# Patient Record
Sex: Female | Born: 1940 | Race: White | Hispanic: No | Smoking: Never smoker
Health system: Southern US, Community
[De-identification: ages and names within clinical notes are randomized; demographics above are authoritative.]

## PROBLEM LIST (undated history)

## (undated) DIAGNOSIS — M810 Age-related osteoporosis without current pathological fracture: Secondary | ICD-10-CM

## (undated) DIAGNOSIS — R51 Headache: Secondary | ICD-10-CM

## (undated) DIAGNOSIS — K219 Gastro-esophageal reflux disease without esophagitis: Secondary | ICD-10-CM

## (undated) DIAGNOSIS — C801 Malignant (primary) neoplasm, unspecified: Secondary | ICD-10-CM

## (undated) DIAGNOSIS — Z923 Personal history of irradiation: Secondary | ICD-10-CM

## (undated) DIAGNOSIS — T8859XA Other complications of anesthesia, initial encounter: Secondary | ICD-10-CM

## (undated) DIAGNOSIS — R519 Headache, unspecified: Secondary | ICD-10-CM

## (undated) DIAGNOSIS — M1990 Unspecified osteoarthritis, unspecified site: Secondary | ICD-10-CM

## (undated) DIAGNOSIS — C50919 Malignant neoplasm of unspecified site of unspecified female breast: Secondary | ICD-10-CM

## (undated) DIAGNOSIS — Z9889 Other specified postprocedural states: Secondary | ICD-10-CM

## (undated) DIAGNOSIS — T4145XA Adverse effect of unspecified anesthetic, initial encounter: Secondary | ICD-10-CM

## (undated) DIAGNOSIS — E7800 Pure hypercholesterolemia, unspecified: Secondary | ICD-10-CM

## (undated) DIAGNOSIS — R112 Nausea with vomiting, unspecified: Secondary | ICD-10-CM

## (undated) DIAGNOSIS — T7840XA Allergy, unspecified, initial encounter: Secondary | ICD-10-CM

## (undated) DIAGNOSIS — R12 Heartburn: Secondary | ICD-10-CM

## (undated) DIAGNOSIS — R7303 Prediabetes: Secondary | ICD-10-CM

## (undated) DIAGNOSIS — R002 Palpitations: Secondary | ICD-10-CM

## (undated) HISTORY — PX: TUBAL LIGATION: SHX77

## (undated) HISTORY — DX: Palpitations: R00.2

## (undated) HISTORY — DX: Malignant (primary) neoplasm, unspecified: C80.1

## (undated) HISTORY — DX: Pure hypercholesterolemia, unspecified: E78.00

## (undated) HISTORY — DX: Allergy, unspecified, initial encounter: T78.40XA

## (undated) HISTORY — PX: INNER EAR SURGERY: SHX679

## (undated) HISTORY — PX: VEIN SURGERY: SHX48

## (undated) HISTORY — DX: Heartburn: R12

## (undated) HISTORY — PX: TONSILLECTOMY AND ADENOIDECTOMY: SHX28

## (undated) HISTORY — DX: Age-related osteoporosis without current pathological fracture: M81.0

## (undated) HISTORY — DX: Gastro-esophageal reflux disease without esophagitis: K21.9

---

## 2008-04-24 HISTORY — PX: SKIN CANCER EXCISION: SHX779

## 2010-08-15 ENCOUNTER — Ambulatory Visit: Payer: Self-pay | Admitting: Gynecology

## 2010-12-16 ENCOUNTER — Encounter (INDEPENDENT_AMBULATORY_CARE_PROVIDER_SITE_OTHER): Payer: Medicare Other | Admitting: Ophthalmology

## 2010-12-16 DIAGNOSIS — H43819 Vitreous degeneration, unspecified eye: Secondary | ICD-10-CM

## 2010-12-16 DIAGNOSIS — D3130 Benign neoplasm of unspecified choroid: Secondary | ICD-10-CM

## 2010-12-16 DIAGNOSIS — H3530 Unspecified macular degeneration: Secondary | ICD-10-CM

## 2010-12-16 DIAGNOSIS — H2510 Age-related nuclear cataract, unspecified eye: Secondary | ICD-10-CM

## 2011-02-07 ENCOUNTER — Ambulatory Visit: Payer: Medicare Other | Admitting: Gynecology

## 2011-02-28 ENCOUNTER — Ambulatory Visit (INDEPENDENT_AMBULATORY_CARE_PROVIDER_SITE_OTHER): Payer: Medicare Other | Admitting: Gynecology

## 2011-02-28 ENCOUNTER — Other Ambulatory Visit (HOSPITAL_COMMUNITY)
Admission: RE | Admit: 2011-02-28 | Discharge: 2011-02-28 | Disposition: A | Discharge status: Home / Self Care | Payer: Medicare Other | Source: Ambulatory Visit | Attending: Gynecology | Admitting: Gynecology

## 2011-02-28 ENCOUNTER — Encounter: Payer: Self-pay | Admitting: Anesthesiology

## 2011-02-28 VITALS — BP 130/84 | Ht 62.5 in | Wt 122.0 lb

## 2011-02-28 DIAGNOSIS — Z01419 Encounter for gynecological examination (general) (routine) without abnormal findings: Secondary | ICD-10-CM | POA: Insufficient documentation

## 2011-02-28 DIAGNOSIS — E7800 Pure hypercholesterolemia, unspecified: Secondary | ICD-10-CM | POA: Insufficient documentation

## 2011-02-28 DIAGNOSIS — C44721 Squamous cell carcinoma of skin of unspecified lower limb, including hip: Secondary | ICD-10-CM | POA: Insufficient documentation

## 2011-02-28 DIAGNOSIS — Z1211 Encounter for screening for malignant neoplasm of colon: Secondary | ICD-10-CM

## 2011-02-28 DIAGNOSIS — Z124 Encounter for screening for malignant neoplasm of cervix: Secondary | ICD-10-CM

## 2011-02-28 DIAGNOSIS — N951 Menopausal and female climacteric states: Secondary | ICD-10-CM | POA: Insufficient documentation

## 2011-02-28 NOTE — Progress Notes (Signed)
Addended by: Bertram Savin A on: 02/28/2011 10:02 AM   Modules accepted: Orders

## 2011-02-28 NOTE — Progress Notes (Addendum)
Crystal Tate 10-26-1940 [id number]   History:    70 y.o.  for annual exam who moved to [place] from [place]. She has established with her primary care physician Dr. Leonette Most who has been monitoring her hypercholesterolemia and according to the patient on her labs were drawn recently. In May of 2011 patient had a stereotactic biopsy of the right breast which patient stated was reported to be benign and had a follow up scan on November 2011 which was normal. Patient frequently does her self breast examination. 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 Patient also stated that she took estrogen replacement therapy for 6-7 years and still continues to suffer from vasomotor symptoms at times. Patient states her last bone density study was one year ago.  Past medical history,surgical history, family history and social history were all reviewed and documented in the EPIC chart.  Gynecologic History Patient's last menstrual period was 02/28/1999. Contraception: none Last Pap: 2011. Results were: normal Last mammogram: 2011. Results were: normal  Obstetric History OB History    Grav Para Term Preterm Abortions TAB SAB Ect Mult Living   2 2 2       2      # Outc Date GA Lbr Len/2nd Wgt Sex Del Anes PTL Lv   1 TRM     F SVD   Yes   2 TRM      SVD   Yes       ROS:  Was performed and pertinent positives and negatives are included in the history.  Exam: chaperone present  BP 130/84  Ht 5' 2.5" (1.588 m)  Wt 122 lb (55.339 kg)  BMI 21.96 kg/m2  LMP 02/28/1999  Body mass index is 21.96 kg/(m^2).  General appearance : Well developed well nourished female. No acute distress HEENT: Neck supple, trachea midline, no carotid bruits, no thyroidmegaly Lungs: Clear to auscultation,  no rhonchi or wheezes, or rib retractions  Heart: Regular rate and rhythm, no murmurs or gallops Breast:Examined in sitting and supine position were symmetrical in appearance, no palpable masses or tenderness,  no skin retraction, no nipple inversion, no nipple discharge, no skin discoloration, no axillary or supraclavicular lymphadenopathy Abdomen: no palpable masses or tenderness, no rebound or guarding Extremities: no edema or skin discoloration or tenderness  Pelvic:  Bartholin, Urethra, Skene Glands: Within normal limits             Vagina: No gross lesions or discharge  Cervix: No gross lesions or discharge  Uterus  anteverted, normal size, shape and consistency, non-tender and mobile  Adnexa  Without masses or tenderness  Anus and perineum  normal   Rectovaginal  normal sphincter tone without palpated masses or tenderness             Hemoccult kit provided to patient is made to the office for testing     Assessment/Plan:  70 y.o. female for annual exam with past history of a right breast stereotactic biopsy in [place] in May of 2011 reported to be benign. Patient follow up scan November 2011 reportedly normal. Patient will be sent to the radiologist her screening mammogram of both breasts. She was encouraged to do her monthly self breast examination. Fecal occult blood testing kit was provided to  the patient for her to submit to the office for testing. We'll do a bone density study next year. She was encouraged to continue her calcium vitamin D to twice a day. She is to engage in weightbearing exercises 45-45 minutes 3 times a week for osteoporosis prevention. And she will use soy products to help with some of her mild vasomotor symptoms and over-the-counter products list was provided to the patient as well. She will continue to follow with her primary physician and was in a copy of this office notes.    Ok Edwards MD, 9:42 AM 02/28/2011    Pap smear was done today

## 2011-03-01 ENCOUNTER — Other Ambulatory Visit: Payer: Self-pay | Admitting: Gynecology

## 2011-03-01 DIAGNOSIS — Z1231 Encounter for screening mammogram for malignant neoplasm of breast: Secondary | ICD-10-CM

## 2011-03-13 LAB — POC HEMOCCULT BLD/STL (OFFICE/1-CARD/DIAGNOSTIC): Fecal Occult Blood, POC: NEGATIVE

## 2011-03-14 ENCOUNTER — Other Ambulatory Visit (INDEPENDENT_AMBULATORY_CARE_PROVIDER_SITE_OTHER): Payer: Medicare Other | Admitting: Gynecology

## 2011-03-14 DIAGNOSIS — Z1211 Encounter for screening for malignant neoplasm of colon: Secondary | ICD-10-CM

## 2011-04-05 ENCOUNTER — Ambulatory Visit
Admission: RE | Admit: 2011-04-05 | Discharge: 2011-04-05 | Disposition: A | Discharge status: Home / Self Care | Payer: Medicare Other | Source: Ambulatory Visit | Attending: Gynecology | Admitting: Gynecology

## 2011-04-05 DIAGNOSIS — Z1231 Encounter for screening mammogram for malignant neoplasm of breast: Secondary | ICD-10-CM

## 2011-05-08 ENCOUNTER — Encounter (HOSPITAL_BASED_OUTPATIENT_CLINIC_OR_DEPARTMENT_OTHER): Payer: Self-pay | Admitting: *Deleted

## 2011-05-08 ENCOUNTER — Emergency Department (HOSPITAL_BASED_OUTPATIENT_CLINIC_OR_DEPARTMENT_OTHER)
Admission: EM | Admit: 2011-05-08 | Discharge: 2011-05-08 | Disposition: A | Discharge status: Home / Self Care | Payer: Medicare Other | Attending: Emergency Medicine | Admitting: Emergency Medicine

## 2011-05-08 DIAGNOSIS — W268XXA Contact with other sharp object(s), not elsewhere classified, initial encounter: Secondary | ICD-10-CM | POA: Insufficient documentation

## 2011-05-08 DIAGNOSIS — S61209A Unspecified open wound of unspecified finger without damage to nail, initial encounter: Secondary | ICD-10-CM | POA: Insufficient documentation

## 2011-05-08 DIAGNOSIS — S61219A Laceration without foreign body of unspecified finger without damage to nail, initial encounter: Secondary | ICD-10-CM

## 2011-05-08 DIAGNOSIS — Z79899 Other long term (current) drug therapy: Secondary | ICD-10-CM | POA: Insufficient documentation

## 2011-05-08 DIAGNOSIS — E7800 Pure hypercholesterolemia, unspecified: Secondary | ICD-10-CM | POA: Insufficient documentation

## 2011-05-08 DIAGNOSIS — Y92009 Unspecified place in unspecified non-institutional (private) residence as the place of occurrence of the external cause: Secondary | ICD-10-CM | POA: Insufficient documentation

## 2011-05-08 MED ORDER — TETANUS-DIPHTH-ACELL PERTUSSIS 5-2.5-18.5 LF-MCG/0.5 IM SUSP
0.5000 mL | Freq: Once | INTRAMUSCULAR | Status: AC
  Administered 2011-05-08: 0.5 mL via INTRAMUSCULAR
  Filled 2011-05-08: qty 0.5

## 2011-05-08 NOTE — ED Notes (Signed)
Moving clothes dryer cut finger on vent attached to dryer bleeding controlled on arrival

## 2011-05-08 NOTE — ED Provider Notes (Signed)
History     CSN: [id number]  Arrival date & time 05/08/11  0915   First MD Initiated Contact with Patient 05/08/11 [phone]      Chief Complaint  Patient presents with  . Laceration    (Consider location/radiation/quality/duration/timing/severity/associated sxs/prior treatment) HPI Patient cut right ring finger this morning moving close to her. She cut the finger on the bed. She did bleed initially but has bleeding controlled on presentation here. She denies any numbness or tingling. Her tetanus is not up-to-date. She is on aspirin but no other blood thinners. Past Medical History  Diagnosis Date  . High cholesterol   . Heart burn   . Cancer     SQUAMOS CELL , LEFT LEG    Past Surgical History  Procedure Date  . Tonsillectomy and adenoidectomy   . Tubal ligation   . Vein surgery   . Inner ear surgery     LEFT..   . Skin cancer excision 2010    LEFT LEG, SQUAMOS CELL,    Family History  Problem Relation Age of Onset  . Cancer Father     ORAL   . Cancer Son 19    MELANOMA    History  Substance Use Topics  . Smoking status: Never Smoker   . Smokeless tobacco: Not on file  . Alcohol Use: 1.2 oz/week    2 Glasses of wine per week     GLASS OF WINE DAILY    OB History    Grav Para Term Preterm Abortions TAB SAB Ect Mult Living   2 2 2       2       Review of Systems  All other systems reviewed and are negative.    Allergies  Other  Home Medications   Current Outpatient Rx  Name Route Sig Dispense Refill  . ASPIRIN 81 MG PO TABS Oral Take 81 mg by mouth daily.      Marland Kitchen BIOTIN 1000 MCG PO TABS Oral Take 1,000 mcg by mouth 3 (three) times daily.      . OMEGA-3 FATTY ACIDS 1000 MG PO CAPS Oral Take 2 g by mouth daily.      Marland Kitchen OMEPRAZOLE 20 MG PO CPDR Oral Take 20 mg by mouth daily.      Marland Kitchen PRAVASTATIN SODIUM 10 MG PO TABS Oral Take 10 mg by mouth daily.        BP 115/57  Pulse 82  Temp(Src) 98.6 F (37 C) (Oral)  Resp 20  SpO2 100%  LMP  02/28/1999  Physical Exam  Vitals reviewed. Constitutional: She appears well-developed and well-nourished.  Musculoskeletal:       2.5 cm laceration palmar aspect fourth finger right hand between PIP and DIP joint. There is a flap present. It has good blood supply. She has full active range of motion with good 2 point sensation.  Neurological: She is alert.  Skin: Skin is warm and dry.  Psychiatric: She has a normal mood and affect.    ED Course  Procedures (including critical care time)  Labs Reviewed - No data to display No results found.   No diagnosis found.    MDM  Patient was anesthetized with 5 cc of 1% lidocaine placed in a digital block. The area was prepped with alcohol prior to the block being placed. The patient obtained good anesthesia. The wound on the palmar aspect of the third finger was visually explored with no evidence of foreign body or tendon involvement. The finger was  ranged to full through a full range of  motion. The flap was repaired with 3 5-0 proline sutures placed in simple interrupted fashion. Patient's tetanus will be updated. She'll be placed in a dressing and a splint. She is advised to have sutures out in 7-10 days        Hilario Quarry, MD 05/08/11 [id number]

## 2011-12-18 ENCOUNTER — Encounter (INDEPENDENT_AMBULATORY_CARE_PROVIDER_SITE_OTHER): Payer: Medicare Other | Admitting: Ophthalmology

## 2012-02-21 ENCOUNTER — Encounter (INDEPENDENT_AMBULATORY_CARE_PROVIDER_SITE_OTHER): Payer: Medicare Other | Admitting: Ophthalmology

## 2012-02-28 ENCOUNTER — Encounter (INDEPENDENT_AMBULATORY_CARE_PROVIDER_SITE_OTHER): Payer: Medicare Other | Admitting: Ophthalmology

## 2012-02-28 DIAGNOSIS — H2510 Age-related nuclear cataract, unspecified eye: Secondary | ICD-10-CM

## 2012-02-28 DIAGNOSIS — H3530 Unspecified macular degeneration: Secondary | ICD-10-CM

## 2012-02-28 DIAGNOSIS — H43819 Vitreous degeneration, unspecified eye: Secondary | ICD-10-CM

## 2012-02-28 DIAGNOSIS — D3130 Benign neoplasm of unspecified choroid: Secondary | ICD-10-CM

## 2012-02-29 ENCOUNTER — Ambulatory Visit (INDEPENDENT_AMBULATORY_CARE_PROVIDER_SITE_OTHER): Payer: Medicare Other | Admitting: Women's Health

## 2012-02-29 ENCOUNTER — Encounter: Payer: Self-pay | Admitting: Women's Health

## 2012-02-29 VITALS — BP 104/76 | Ht 62.75 in | Wt 120.0 lb

## 2012-02-29 DIAGNOSIS — M899 Disorder of bone, unspecified: Secondary | ICD-10-CM

## 2012-02-29 DIAGNOSIS — M8580 Other specified disorders of bone density and structure, unspecified site: Secondary | ICD-10-CM

## 2012-02-29 NOTE — Progress Notes (Signed)
Crystal Tate September 22, 1940 [id number]    History:    The patient presents for breast and pelvic exam. Postmenopausal/no HRT/no bleeding. History of normal Paps.Mammograms normal 2012, 13, benign breast biopsy 2011. Hypercholesterolemia treated by primary care. Last DEXA 2000 and done in [place] reports some weakness of her bones. Normal colonoscopy 2006, negative Hemoccult 2012. Current on immunizations.  Past medical history, past surgical history, family history and social history were all reviewed and documented in the EPIC chart. History of squamous skin cancer on  leg.  ROS:  A  ROS was performed and pertinent positives and negatives are included in the history.  Exam:  Filed Vitals:   02/29/12 0901  BP: 104/76    General appearance:  Normal Head/Neck:  Normal, without cervical or supraclavicular adenopathy. Thyroid:  Symmetrical, normal in size, without palpable masses or nodularity. Respiratory  Effort:  Normal  Auscultation:  Clear without wheezing or rhonchi Cardiovascular  Auscultation:  Regular rate, without rubs, murmurs or gallops  Edema/varicosities:  Not grossly evident Abdominal  Soft,nontender, without masses, guarding or rebound.  Liver/spleen:  No organomegaly noted  Hernia:  None appreciated  Skin  Inspection:  Grossly normal  Palpation:  Grossly normal Neurologic/psychiatric  Orientation:  Normal with appropriate conversation.  Mood/affect:  Normal  Genitourinary    Breasts: Examined lying and sitting.     Right: Without masses, retractions, discharge or axillary adenopathy.     Left: Without masses, retractions, discharge or axillary adenopathy.   Inguinal/mons:  Normal without inguinal adenopathy  External genitalia:  Normal  BUS/Urethra/Skene's glands:  Normal  Bladder:  Normal  Vagina:  Normal  Cervix:  Normal  Uterus:   normal in size, shape and contour.  Midline and mobile  Adnexa/parametria:     Rt: Without masses or tenderness.   Lt: Without  masses or tenderness.  Anus and perineum: Normal  Digital rectal exam: Normal sphincter tone without palpated masses or tenderness  Assessment/Plan:  71 y.o. M. WF G2 P2  for breast and pelvic exam with no complaints.   Normal postmenopausal exam Questionable osteopenia Hypercholesteremia-primary care labs and meds  Plan: DEXA, instructed to schedule here. Reviewed importance of exercise, vitamin D 2000 daily, home safety and fall prevention discussed. Home Hemoccult card given with instructions, UA, normal Pap 2012, reviewed new screening guidelines. SBE's, continue annual mammogram, calcium rich diet encouraged. Continue annual skin checks with dermatologist.   Harrington Challenger [facility], 12:53 PM 02/29/2012

## 2012-02-29 NOTE — Patient Instructions (Signed)

## 2012-03-04 ENCOUNTER — Other Ambulatory Visit: Payer: Self-pay | Admitting: Gynecology

## 2012-03-04 DIAGNOSIS — Z1231 Encounter for screening mammogram for malignant neoplasm of breast: Secondary | ICD-10-CM

## 2012-04-12 ENCOUNTER — Ambulatory Visit
Admission: RE | Admit: 2012-04-12 | Discharge: 2012-04-12 | Disposition: A | Discharge status: Home / Self Care | Payer: Medicare Other | Source: Ambulatory Visit | Attending: Gynecology | Admitting: Gynecology

## 2012-04-12 DIAGNOSIS — Z1231 Encounter for screening mammogram for malignant neoplasm of breast: Secondary | ICD-10-CM

## 2012-06-12 ENCOUNTER — Other Ambulatory Visit: Payer: Self-pay | Admitting: Gynecology

## 2012-11-13 ENCOUNTER — Encounter: Payer: Self-pay | Admitting: Women's Health

## 2012-11-13 ENCOUNTER — Ambulatory Visit (INDEPENDENT_AMBULATORY_CARE_PROVIDER_SITE_OTHER): Payer: Medicare Other | Admitting: Women's Health

## 2012-11-13 DIAGNOSIS — N3941 Urge incontinence: Secondary | ICD-10-CM

## 2012-11-13 NOTE — Patient Instructions (Signed)
Cystocele Repair A cystocele is a bulging, drooping hernia or break (rupture) of bladder tissue into the birth canal (vagina). This bulging or rupture occurs on the top front wall of the vagina. CAUSES  Cystocele is associated with weakness of the top front wall of the vagina due to stretching and tearing of the ligaments and muscles in the area. This is often the result of:  Multiple childbirths.  Continuous heavy lifting.  Chronic cough from asthma, emphysema, or smoking.  Being overweight.  Changes from aging.  Previous surgery in the vaginal area.  Menopause with loss of estrogen hormone and weakening of the ligaments and muscles around the bladder. SYMPTOMS   Uncontrolled loss of urine (incontinence) with cough, sneeze, or exercise.  Pelvic pressure.  Frequency or urgency to urinate because of inability to completely empty the bladder.  Bladder infections.  Needing to push on the upper vagina to help yourself pass urine. DIAGNOSIS  A cystocele can be diagnosed by doing a pelvic exam and observing the top of the vagina drooping or bulging into or out of the vagina. TREATMENT  Surgical options:  Cystocele repair is surgery that removes the hernia.  There are also different "sling" operations that may be used. Discuss the different types of surgeries to repair a cystocele with your caregiver. Your caregiver will decide what type of surgery will be best in your case. Nonsurgical options:  Kegel exercises. This helps strengthen and tighten the muscles and tissue in and around the bladder and vagina. This may help with mild cases of cystocele.  A pessary may help the cystocele. A pessary is a plastic or rubber device that lifts the bladder into place. A pessary must be fitted by a doctor.  Tampons or diaphragms that lift the bladder into place are sometimes helpful with a minor or small cystocele.  Estrogen may help with mild cases in menopausal and aging women. LET YOUR  CAREGIVER KNOW ABOUT:   Allergies to food or medicine.  Medicines taken, including vitamins, herbs, eyedrops, over-the-counter medicines, and creams.  Use of steroids (by mouth or creams).  Previous problems with anesthetics or numbing medicines.  History of bleeding problems or blood clots.  Previous surgery.  Other health problems, including diabetes and kidney problems.  Possibility of pregnancy, if this applies. RISKS AND COMPLICATIONS  All surgery is associated with risks.  There are risks with a general anesthesia. You should discuss this with your caregiver.  With spinal or epidural anesthesia, there may be an area that is not numbed, and you could feel pain.  Headache could occur with a spinal or epidural anesthetic.  The catheter you will have after surgery may not work properly or may get blocked and need to be replaced.  Excessive bleeding.  Infection.  Injury to surrounding structures.  Recurrence of the cystocele.  Surgery may not get rid of your symptoms. BEFORE THE PROCEDURE   Do not take aspirin or blood thinners for 1 week prior to surgery, unless instructed otherwise.  Do not eat or drink anything after midnight the night before surgery.  Let your caregiver know if you develop a cold or other infectious problems prior to surgery.  If being admitted the day of surgery, you should be present 1 hour prior to your procedure or as directed by your caregiver.  Plan and arrange for help when you go home from the hospital.  If you smoke, do not smoke for at least 2 weeks before the surgery.  Do not   drink any alcohol for 3 days before the surgery. PROCEDURE  You will be given an anesthetic to prevent you from feeling pain during surgery. This may be a general anesthetic that puts you to sleep, or a spinal or epidural anesthetic. You will be asleep or be numbed through the entire procedure. During cystocele repair, tissue is pulled from the sides and  around the top of the vagina to lift up the hernia. This removes the hernia so that the top of the vagina does not fall into the opening of the vagina. AFTER THE PROCEDURE  After surgery, you will be taken to the recovery room where a nurse will take care of you, checking your breathing, blood pressure, pulse, and your progress. When your caregiver feels you are stable, you will be taken to your room. You will have a drainage tube (Foley catheter) that will drain your bladder for 2 to 7 days or longer, until your bladder is working properly. This catheter is placed prior to surgery to help keep your bladder empty and out of the way during the procedure. After surgery, this will make passing your urine easier. The catheter will be removed when you can easily pass urine without this assistance. You may have gauze packing in the vagina that will be removed 1 to 2 days after the surgery. Usually, you will be given a medicine (antibiotic) that kills germs. You will be given pain medicine as needed. You can usually go home in 3 to 5 days. HOME CARE INSTRUCTIONS   Do not take baths. Take showers until your caregiver informs you otherwise.  Take antibiotics as directed by your caregiver.  Exercise as instructed. Do not perform exercises which increase the pressure inside your belly (abdomen), such as sit-ups or lifting weights, until your caregiver has given permission. Walking exercise is preferred.  Only take over-the-counter or prescription medicines for pain and discomfort as directed by your caregiver.  Do not drink alcohol while taking pain medicine.  Do not lift anything over 5 pounds.  Do not drive until your caregiver gives you permission.  Get plenty of rest and sleep.  Have someone help with your household chores for 1 to 2 weeks.  If you develop constipation, you may take a mild laxative with your caregiver's permission. Eating bran foods and drinking enough water and fluids to keep your  urine clear or pale yellow helps with constipation.  Do not take aspirin. It may cause bleeding.  You may resume normal diet and unstrenuous activities as directed.  Do not douche, use tampons, or engage in intercourse until your surgeon has given permission.  Change bandages (dressings) as directed.  Make and keep all your postoperative appointments. SEEK MEDICAL CARE IF:   You have abnormal vaginal discharge.  You develop a rash.  You are having a reaction to your medicine.  You develop nausea or vomiting. SEEK IMMEDIATE MEDICAL CARE IF:   You have redness, swelling, or increasing pain in the vaginal area.  You notice pus coming from the vagina.  You have a fever.  You notice a bad smell coming from the vagina.  You have increasing abdominal pain.  You have frequent urination or you notice burning during urination.  You notice blood in your urine.  You have excessive vaginal bleeding.  You cannot urinate. MAKE SURE YOU:   Understand these instructions.  Will watch your condition.  Will get help right away if you are not doing well or get worse. Document Released:   04/07/2000 Document Revised: 07/03/2011 Document Reviewed: 07/08/2009 [employer] Patient Information 2014 [employer].  

## 2012-11-13 NOTE — Progress Notes (Signed)
Patient ID: Crystal Tate, female   DOB: [birth year], 72 y.o.   MRN: [id number] Presents to discuss possible cystocele diagnosed by primary care. Had a UTI that needed to be treated by 2 different antibiotics. Totally asymptomatic now and has had a negative UA. Has some problems with urge incontinence. States when has to urinate, needs to go quickly or does have some leakage. Does not wear a pad, changes underwear one time a day or none due to leakage. Nocturia x1. Denies stress incontinence with cough, sneeze, laughing. Has 2 children the largest weighing 8 lbs. 8 oz.  Weight is stable, 115, but states does have difficulty losing weight in lower abdomen. Active lifestyle, plays golf and does a lot of gardening.  Exam: Appears well. External genitalia, varicosities noted on left labia extending down left leg. Mild cystocele noted, no change or protrusion with bearing down,and no leakage of urine. Bimanual, vaginal dryness, good pelvic support.  Minimal urge incontinence with mild cystocele  Plan: Reviewed cystocele.  Reviewed no intervention is needed at this time, reviewed importance of avoiding heavy lifting, emptying bladder regular intervals, avoiding caffeinated beverages. Instructed to call if problems with incontinence or a change.

## 2013-02-06 ENCOUNTER — Other Ambulatory Visit: Payer: Self-pay | Admitting: Dermatology

## 2013-02-28 ENCOUNTER — Ambulatory Visit (INDEPENDENT_AMBULATORY_CARE_PROVIDER_SITE_OTHER): Payer: Medicare Other | Admitting: Ophthalmology

## 2013-03-10 ENCOUNTER — Ambulatory Visit (INDEPENDENT_AMBULATORY_CARE_PROVIDER_SITE_OTHER): Payer: Medicare Other | Admitting: Ophthalmology

## 2013-03-19 ENCOUNTER — Encounter: Payer: Medicare Other | Admitting: Women's Health

## 2013-03-27 ENCOUNTER — Ambulatory Visit (INDEPENDENT_AMBULATORY_CARE_PROVIDER_SITE_OTHER): Payer: Medicare Other

## 2013-03-27 ENCOUNTER — Other Ambulatory Visit: Payer: Self-pay | Admitting: Gynecology

## 2013-03-27 DIAGNOSIS — M810 Age-related osteoporosis without current pathological fracture: Secondary | ICD-10-CM

## 2013-03-27 DIAGNOSIS — M8580 Other specified disorders of bone density and structure, unspecified site: Secondary | ICD-10-CM

## 2013-03-31 ENCOUNTER — Other Ambulatory Visit: Payer: Self-pay | Admitting: *Deleted

## 2013-03-31 DIAGNOSIS — M810 Age-related osteoporosis without current pathological fracture: Secondary | ICD-10-CM

## 2013-04-01 ENCOUNTER — Encounter: Payer: Self-pay | Admitting: Women's Health

## 2013-04-01 ENCOUNTER — Other Ambulatory Visit (HOSPITAL_COMMUNITY)
Admission: RE | Admit: 2013-04-01 | Discharge: 2013-04-01 | Disposition: A | Discharge status: Home / Self Care | Payer: Medicare Other | Source: Ambulatory Visit | Attending: Gynecology | Admitting: Gynecology

## 2013-04-01 ENCOUNTER — Ambulatory Visit (INDEPENDENT_AMBULATORY_CARE_PROVIDER_SITE_OTHER): Payer: Medicare Other | Admitting: Women's Health

## 2013-04-01 VITALS — BP 104/64 | Ht 62.75 in | Wt 116.0 lb

## 2013-04-01 DIAGNOSIS — Z124 Encounter for screening for malignant neoplasm of cervix: Secondary | ICD-10-CM | POA: Insufficient documentation

## 2013-04-01 DIAGNOSIS — M810 Age-related osteoporosis without current pathological fracture: Secondary | ICD-10-CM

## 2013-04-01 NOTE — Patient Instructions (Signed)
Health Recommendations for Postmenopausal Women Respected and ongoing research has looked at the most common causes of death, disability, and poor quality of life in postmenopausal women. The causes include heart disease, diseases of blood vessels, diabetes, depression, cancer, and bone loss (osteoporosis). Many things can be done to help lower the chances of developing these and other common problems: CARDIOVASCULAR DISEASE Heart Disease: A heart attack is a medical emergency. Know the signs and symptoms of a heart attack. Below are things women can do to reduce their risk for heart disease.   Do not smoke. If you smoke, quit.  Aim for a healthy weight. Being overweight causes many preventable deaths. Eat a healthy and balanced diet and drink an adequate amount of liquids.  Get moving. Make a commitment to be more physically active. Aim for 30 minutes of activity on most, if not all days of the week.  Eat for heart health. Choose a diet that is low in saturated fat and cholesterol and eliminate trans fat. Include whole grains, vegetables, and fruits. Read and understand the labels on food containers before buying.  Know your numbers. Ask your caregiver to check your blood pressure, cholesterol (total, HDL, LDL, triglycerides) and blood glucose. Work with your caregiver on improving your entire clinical picture.  High blood pressure. Limit or stop your table salt intake (try salt substitute and food seasonings). Avoid salty foods and drinks. Read labels on food containers before buying. Eating well and exercising can help control high blood pressure. STROKE  Stroke is a medical emergency. Stroke may be the result of a blood clot in a blood vessel in the brain or by a brain hemorrhage (bleeding). Know the signs and symptoms of a stroke. To lower the risk of developing a stroke:  Avoid fatty foods.  Quit smoking.  Control your diabetes, blood pressure, and irregular heart rate. THROMBOPHLEBITIS  (BLOOD CLOT) OF THE LEG  Becoming overweight and leading a stationary lifestyle may also contribute to developing blood clots. Controlling your diet and exercising will help lower the risk of developing blood clots. CANCER SCREENING  Breast Cancer: Take steps to reduce your risk of breast cancer.  You should practice "breast self-awareness." This means understanding the normal appearance and feel of your breasts and should include breast self-examination. Any changes detected, no matter how small, should be reported to your caregiver.  After age 40, you should have a clinical breast exam (CBE) every year.  Starting at age 40, you should consider having a mammogram (breast X-ray) every year.  If you have a family history of breast cancer, talk to your caregiver about genetic screening.  If you are at high risk for breast cancer, talk to your caregiver about having an MRI and a mammogram every year.  Intestinal or Stomach Cancer: Tests to consider are a rectal exam, fecal occult blood, sigmoidoscopy, and colonoscopy. Women who are high risk may need to be screened at an earlier age and more often.  Cervical Cancer:  Beginning at age 30, you should have a Pap test every 3 years as long as the past 3 Pap tests have been normal.  If you have had past treatment for cervical cancer or a condition that could lead to cancer, you need Pap tests and screening for cancer for at least 20 years after your treatment.  If you had a hysterectomy for a problem that was not cancer or a condition that could lead to cancer, then you no longer need Pap tests.    If you are between ages 65 and 70, and you have had normal Pap tests going back 10 years, you no longer need Pap tests.  If Pap tests have been discontinued, risk factors (such as a new sexual partner) need to be reassessed to determine if screening should be resumed.  Some medical problems can increase the chance of getting cervical cancer. In these  cases, your caregiver may recommend more frequent screening and Pap tests.  Uterine Cancer: If you have vaginal bleeding after reaching menopause, you should notify your caregiver.  Ovarian cancer: Other than yearly pelvic exams, there are no reliable tests available to screen for ovarian cancer at this time except for yearly pelvic exams.  Lung Cancer: Yearly chest X-rays can detect lung cancer and should be done on high risk women, such as cigarette smokers and women with chronic lung disease (emphysema).  Skin Cancer: A complete body skin exam should be done at your yearly examination. Avoid overexposure to the sun and ultraviolet light lamps. Use a strong sun block cream when in the sun. All of these things are important in lowering the risk of skin cancer. MENOPAUSE Menopause Symptoms: Hormone therapy products are effective for treating symptoms associated with menopause:  Moderate to severe hot flashes.  Night sweats.  Mood swings.  Headaches.  Tiredness.  Loss of sex drive.  Insomnia.  Other symptoms. Hormone replacement carries certain risks, especially in older women. Women who use or are thinking about using estrogen or estrogen with progestin treatments should discuss that with their caregiver. Your caregiver will help you understand the benefits and risks. The ideal dose of hormone replacement therapy is not known. The Food and Drug Administration (FDA) has concluded that hormone therapy should be used only at the lowest doses and for the shortest amount of time to reach treatment goals.  OSTEOPOROSIS Protecting Against Bone Loss and Preventing Fracture: If you use hormone therapy for prevention of bone loss (osteoporosis), the risks for bone loss must outweigh the risk of the therapy. Ask your caregiver about other medications known to be safe and effective for preventing bone loss and fractures. To guard against bone loss or fractures, the following is recommended:  If  you are less than age 50, take 1000 mg of calcium and at least 600 mg of Vitamin D per day.  If you are greater than age 50 but less than age 70, take 1200 mg of calcium and at least 600 mg of Vitamin D per day.  If you are greater than age 70, take 1200 mg of calcium and at least 800 mg of Vitamin D per day. Smoking and excessive alcohol intake increases the risk of osteoporosis. Eat foods rich in calcium and vitamin D and do weight bearing exercises several times a week as your caregiver suggests. DIABETES Diabetes Melitus: If you have Type I or Type 2 diabetes, you should keep your blood sugar under control with diet, exercise and recommended medication. Avoid too many sweets, starchy and fatty foods. Being overweight can make control more difficult. COGNITION AND MEMORY Cognition and Memory: Menopausal hormone therapy is not recommended for the prevention of cognitive disorders such as Alzheimer's disease or memory loss.  DEPRESSION  Depression may occur at any age, but is common in elderly women. The reasons may be because of physical, medical, social (loneliness), or financial problems and needs. If you are experiencing depression because of medical problems and control of symptoms, talk to your caregiver about this. Physical activity and   exercise may help with mood and sleep. Community and volunteer involvement may help your sense of value and worth. If you have depression and you feel that the problem is getting worse or becoming severe, talk to your caregiver about treatment options that are best for you. ACCIDENTS  Accidents are common and can be serious in the elderly woman. Prepare your house to prevent accidents. Eliminate throw rugs, place hand bars in the bath, shower and toilet areas. Avoid wearing high heeled shoes or walking on wet, snowy, and icy areas. Limit or stop driving if you have vision or hearing problems, or you feel you are unsteady with you movements and  reflexes. HEPATITIS C Hepatitis C is a type of viral infection affecting the liver. It is spread mainly through contact with blood from an infected person. It can be treated, but if left untreated, it can lead to severe liver damage over years. Many people who are infected do not know that the virus is in their blood. If you are a "baby-boomer", it is recommended that you have one screening test for Hepatitis C. IMMUNIZATIONS  Several immunizations are important to consider having during your senior years, including:   Tetanus, diptheria, and pertussis booster shot.  Influenza every year before the flu season begins.  Pneumonia vaccine.  Shingles vaccine.  Others as indicated based on your specific needs. Talk to your caregiver about these. Document Released: 06/02/2005 Document Revised: 03/27/2012 Document Reviewed: 01/27/2008 [employer] Patient Information 2014 [employer].  

## 2013-04-01 NOTE — Progress Notes (Signed)
Crystal Tate August 12, 1940 [id number]    History:    The patient presents for breast and pelvic exam. Postmenopausal on no HRT/no bleeding. Normal Paps. 08/2009 negative breast biopsy normal mammograms after. Recently had a DEXA here scheduled for followup appointment with Dr. Lily Peer to discuss.  Past medical history, past surgical history, family history and social history were all reviewed and documented in the EPIC chart. Moved here from [place]. [place]. Squamous cell skin cancer 2010. Hypercholesteremia primary care manages.   Exam:  Filed Vitals:   04/01/13 1016  BP: 104/64    General appearance:  Normal Head/Neck:  Normal, without cervical or supraclavicular adenopathy. Thyroid:  Symmetrical, normal in size, without palpable masses or nodularity. Respiratory  Effort:  Normal  Auscultation:  Clear without wheezing or rhonchi Cardiovascular  Auscultation:  Regular rate, without rubs, murmurs or gallops  Edema/varicosities:  Not grossly evident Abdominal  Soft,nontender, without masses, guarding or rebound.  Liver/spleen:  No organomegaly noted  Hernia:  None appreciated  Skin  Inspection:  Grossly normal  Palpation:  Grossly normal Neurologic/psychiatric  Orientation:  Normal with appropriate conversation.  Mood/affect:  Normal  Genitourinary    Breasts: Examined lying and sitting.     Right: Without masses, retractions, discharge or axillary adenopathy.     Left: Without masses, retractions, discharge or axillary adenopathy.   Inguinal/mons:  Normal without inguinal adenopathy  External genitalia:  Normal  BUS/Urethra/Skene's glands:  Normal  Bladder:  Normal  Vagina:  Normal  Cervix:  Normal  Uterus:  normal in size, shape and contour.  Midline and mobile  Adnexa/parametria:     Rt: Without masses or tenderness.   Lt: Without masses or tenderness.  Anus and perineum: Normal  Digital rectal exam: Normal sphincter tone without palpated masses or  tenderness  Assessment/Plan:  72 y.o. MWF G2P2 for breast and pelvic exam with no complaints.  Hypercholesteremia-primary care labs and meds Squamous cell skin cancer 2010 Normal postmenopausal/no HRT/no bleeding  Plan: SBE's, continue annual mammogram, calcium rich diet, vitamin D 2000 daily encouraged. Home safety and fall prevention discussed. Scheduled for a followup from DEXA with Dr. Lily Peer. Pap. Pap normal 2012, new screening guidelines reviewed. Continue annual skin check. Schedule 10 year screening colonoscopy.   Harrington Challenger [employer], 10:42 AM 04/01/2013

## 2013-04-01 NOTE — Addendum Note (Signed)
Addended by: Richardson Chiquito on: 04/01/2013 11:14 AM   Modules accepted: Orders

## 2013-04-02 LAB — PTH, INTACT AND CALCIUM: Calcium: 9.7 mg/dL (ref 8.4–10.5)

## 2013-04-10 ENCOUNTER — Ambulatory Visit (INDEPENDENT_AMBULATORY_CARE_PROVIDER_SITE_OTHER): Payer: Medicare Other | Admitting: Ophthalmology

## 2013-04-10 DIAGNOSIS — H3530 Unspecified macular degeneration: Secondary | ICD-10-CM

## 2013-04-10 DIAGNOSIS — D3130 Benign neoplasm of unspecified choroid: Secondary | ICD-10-CM

## 2013-04-10 DIAGNOSIS — H2510 Age-related nuclear cataract, unspecified eye: Secondary | ICD-10-CM

## 2013-04-10 DIAGNOSIS — H43819 Vitreous degeneration, unspecified eye: Secondary | ICD-10-CM

## 2013-05-14 ENCOUNTER — Ambulatory Visit (INDEPENDENT_AMBULATORY_CARE_PROVIDER_SITE_OTHER): Payer: Medicare Other | Admitting: Gynecology

## 2013-05-14 ENCOUNTER — Encounter: Payer: Self-pay | Admitting: Gynecology

## 2013-05-14 VITALS — BP 120/76

## 2013-05-14 DIAGNOSIS — M810 Age-related osteoporosis without current pathological fracture: Secondary | ICD-10-CM

## 2013-05-14 DIAGNOSIS — K219 Gastro-esophageal reflux disease without esophagitis: Secondary | ICD-10-CM

## 2013-05-14 DIAGNOSIS — R636 Underweight: Secondary | ICD-10-CM

## 2013-05-14 DIAGNOSIS — N951 Menopausal and female climacteric states: Secondary | ICD-10-CM

## 2013-05-14 DIAGNOSIS — Z780 Asymptomatic menopausal state: Secondary | ICD-10-CM

## 2013-05-14 NOTE — Patient Instructions (Signed)
Teriparatide injection What is this medicine? TERIPARATIDE (terr ih PAR a tyd) increases bone mass and strength. It helps make healthy bone and to slow bone loss. This medicine is used to prevent bone fractures. This medicine may be used for other purposes; ask your health care provider or pharmacist if you have questions. COMMON BRAND NAME(S): Forteo What should I tell my health care provider before I take this medicine? They need to know if you have any of these conditions: -bone disease other than osteoporosis -heart, kidney or liver disease -history of cancer in the bone -kidney stone -Paget's disease -parathyroid disease -receiving radiation therapy -an unusual or allergic reaction to teriparatide, other medicines, foods, dyes, or preservatives -pregnant or trying to get pregnant -breast-feeding How should I use this medicine? This medicine comes in an injection pen device. This pen injects the medicine under your skin. Follow the directions on the prescription label. You will be taught how to use this medicine. Take your medicine at regular intervals. Do not take your medicine more often than directed. Do not use this medicine if it has solid particles in it, or if it is cloudy or colored. It should be clear and colorless. Be sure that you are using your pen device correctly.  A special MedGuide will be given to you by the pharmacist with each prescription and refill. Be sure to read this information carefully each time. Talk to your pediatrician regarding the use of this medicine in children. Special care may be needed. Overdosage: If you think you have taken too much of this medicine contact a poison control center or emergency room at once. NOTE: This medicine is only for you. Do not share this medicine with others. What if I miss a dose? If you miss a dose, take it as soon as you can. If it is almost time for your next dose, take only that dose. Do not take double or extra  doses. What may interact with this medicine? -digoxin -other medicines to strengthen bone This list may not describe all possible interactions. Give your health care provider a list of all the medicines, herbs, non-prescription drugs, or dietary supplements you use. Also tell them if you smoke, drink alcohol, or use illegal drugs. Some items may interact with your medicine. What should I watch for while using this medicine? Visit your doctor or health care professional for regular checks on your progress. Your doctor may order blood tests and other tests to see how you are doing. You should make sure you get enough calcium and vitamin D while you are taking this medicine, unless your doctor tells you not to. Discuss the foods you eat and the vitamins you take with your health care professional. Dennis Bast may get drowsy or dizzy. Do not drive, use machinery, or do anything that needs mental alertness until you know how this medicine affects you. Do not stand or sit up quickly, especially if you are an older patient. This reduces the risk of dizzy or fainting spells. What side effects may I notice from receiving this medicine? Side effects that you should report to your doctor or health care professional as soon as possible: -allergic reactions like skin rash, itching or hives, swelling of the face, lips, or tongue -chronic constipation -high blood pressure -irregular heartbeat, chest pain -nausea, vomiting -unusually weak or tired Side effects that usually do not require medical attention (report to your doctor or health care professional if they continue or are bothersome): -bone pain -cough, runny  nose -headache -leg cramps -muscle spasms in the back or legs -pain, redness, irritation or swelling at the injection site -stomach upset -trouble sleeping This list may not describe all possible side effects. Call your doctor for medical advice about side effects. You may report side effects to FDA at  1-800-FDA-1088. Where should I keep my medicine? Keep out of the reach of children. Store in a refrigerator between 2 and 8 degrees C (36 and 46 degrees F). Do not freeze. Recap the pen injector when not in use to protect it from light and damage. Use quickly after taking out of the refrigerator and return to refrigerator right after using. Throw away any unused medicine 28 days after the first injection from the device. Do not use after the expiration date printed on the pen and pen packaging. NOTE: This sheet is a summary. It may not cover all possible information. If you have questions about this medicine, talk to your doctor, pharmacist, or health care provider.  2014, Elsevier/Gold Standard. (2008-04-07 17:45:37) Osteoporosis Throughout your life, your body breaks down old bone and replaces it with new bone. As you get older, your body does not replace bone as quickly as it breaks it down. By the age of 4 years, most people begin to gradually lose bone because of the imbalance between bone loss and replacement. Some people lose more bone than others. Bone loss beyond a specified normal degree is considered osteoporosis.  Osteoporosis affects the strength and durability of your bones. The inside of the ends of your bones and your flat bones, like the bones of your pelvis, look like honeycomb, filled with tiny open spaces. As bone loss occurs, your bones become less dense. This means that the open spaces inside your bones become bigger and the walls between these spaces become thinner. This makes your bones weaker. Bones of a person with osteoporosis can become so weak that they can break (fracture) during minor accidents, such as a simple fall. CAUSES  The following factors have been associated with the development of osteoporosis:  Smoking.  Drinking more than 2 alcoholic drinks several days per week.  Long-term use of certain medicines:  Corticosteroids.  Chemotherapy  medicines.  Thyroid medicines.  Antiepileptic medicines.  Gonadal hormone suppression medicine.  Immunosuppression medicine.  Being underweight.  Lack of physical activity.  Lack of exposure to the sun. This can lead to vitamin D deficiency.  Certain medical conditions:  Certain inflammatory bowel diseases, such as Crohn disease and ulcerative colitis.  Diabetes.  Hyperthyroidism.  Hyperparathyroidism. RISK FACTORS Anyone can develop osteoporosis. However, the following factors can increase your risk of developing osteoporosis:  Gender Women are at higher risk than men.  Age Being older than 31 years increases your risk.  Ethnicity White and Asian people have an increased risk.  Weight Being extremely underweight can increase your risk of osteoporosis.  Family history of osteoporosis Having a family member who has developed osteoporosis can increase your risk. SYMPTOMS  Usually, people with osteoporosis have no symptoms.  DIAGNOSIS  Signs during a physical exam that may prompt your caregiver to suspect osteoporosis include:  Decreased height. This is usually caused by the compression of the bones that form your spine (vertebrae) because they have weakened and become fractured.  A curving or rounding of the upper back (kyphosis). To confirm signs of osteoporosis, your caregiver may request a procedure that uses 2 low-dose X-ray beams with different levels of energy to measure your bone mineral density (dual-energy  X-ray absorptiometry [DXA]). Also, your caregiver may check your level of vitamin D. TREATMENT  The goal of osteoporosis treatment is to strengthen bones in order to decrease the risk of bone fractures. There are different types of medicines available to help achieve this goal. Some of these medicines work by slowing the processes of bone loss. Some medicines work by increasing bone density. Treatment also involves making sure that your levels of calcium and  vitamin D are adequate. PREVENTION  There are things you can do to help prevent osteoporosis. Adequate intake of calcium and vitamin D can help you achieve optimal bone mineral density. Regular exercise can also help, especially resistance and weight-bearing activities. If you smoke, quitting smoking is an important part of osteoporosis prevention. MAKE SURE YOU:  Understand these instructions.  Will watch your condition.  Will get help right away if you are not doing well or get worse. FOR MORE INFORMATION www.osteo.org and EquipmentWeekly.com.ee Document Released: 01/18/2005 Document Revised: 08/05/2012 Document Reviewed: 03/25/2011 [facility] Patient Information 2014 [place], [place].

## 2013-05-14 NOTE — Progress Notes (Signed)
   Patient presented to the office today to discuss her bone density study that was done in our office recently. Patient with a known history of osteoporosis which was diagnosed several years ago in [place]. Patient stated that initially she had been on Fosamax for 3-4 years but continued to loose bone and also was having difficulty tolerating the medication because of her GERD. She was then switched over to Reclast for which she took for one year and then move to [place]. Her osteoporosis was diagnosed in 2009. We do not have report from her previous bone density study done in [place].  Results a bone density study done here in our office in December 2014 demonstrated that the lowest T. score was -4.2 at the Ap/Spine. This indicates that patient has lost more than 25% of her bone mass and has an 8 times greater risk of fracture.  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 Because of her long-standing history of osteoporosis and interruption in therapy since she moved to [place] we need to start her on Forteo to help build bone for 2 years. We can then transition her to either Reclast or Prolia. The risks benefits and pros and cons of the medication were discussed to black box wanting such as osteosarcoma as seen in animal studies. . Patient would normal calcium, vitamin D and PTH level recently here in our office. She was instructed to continue her calcium and vitamin D twice a day along with weight bearing exercises for 3-4 times a week. Patient cannot tolerate oral bisphosphonate because of her GERD.

## 2013-06-23 ENCOUNTER — Telehealth: Payer: Self-pay | Admitting: *Deleted

## 2013-06-23 DIAGNOSIS — Z113 Encounter for screening for infections with a predominantly sexual mode of transmission: Secondary | ICD-10-CM

## 2013-06-23 NOTE — Telephone Encounter (Signed)
Benefits have been requested and PA approved through insurance but still has a large copay due to deductible not being met, per [employer]. The patient was going to let the them know mid March as they spoke 06/06/13. I left the patient a message to call me back KW CMA

## 2013-08-14 NOTE — Telephone Encounter (Signed)
It has been an ongoing process with [place] working with the patients insurance. She has a $95 copay and they are working at getting that reduced. The patient will follow up with me and contact me to set instruction up when she is to start the medication.   [facility]

## 2013-08-21 NOTE — Telephone Encounter (Signed)
Pt is going to proceed with forteo just not yet. Going out of town for several weeks and will start when she returns. KW CMA

## 2014-02-23 ENCOUNTER — Encounter: Payer: Self-pay | Admitting: Gynecology

## 2014-04-02 ENCOUNTER — Ambulatory Visit (INDEPENDENT_AMBULATORY_CARE_PROVIDER_SITE_OTHER): Payer: Medicare Other | Admitting: Women's Health

## 2014-04-02 ENCOUNTER — Encounter: Payer: Self-pay | Admitting: Women's Health

## 2014-04-02 VITALS — BP 122/80 | Ht 62.0 in | Wt 114.0 lb

## 2014-04-02 DIAGNOSIS — M810 Age-related osteoporosis without current pathological fracture: Secondary | ICD-10-CM

## 2014-04-02 NOTE — Patient Instructions (Signed)
Bone Health Our bones do many things. They provide structure, protect organs, anchor muscles, and store calcium. Adequate calcium in your diet and weight-bearing physical activity help build strong bones, improve bone amounts, and may reduce the risk of weakening of bones (osteoporosis) later in life. PEAK BONE MASS By age 72, the average woman has acquired most of her skeletal bone mass. A large decline occurs in older adults which increases the risk of osteoporosis. In women this occurs around the time of menopause. It is important for Marilyn Nihiser girls to reach their peak bone mass in order to maintain bone health throughout life. A person with high bone mass as a Keven Soucy adult will be more likely to have a higher bone mass later in life. Not enough calcium consumption and physical activity early on could result in a failure to achieve optimum bone mass in adulthood. OSTEOPOROSIS Osteoporosis is a disease of the bones. It is defined as low bone mass with deterioration of bone structure. Osteoporosis leads to an increase risk of fractures with falls. These fractures commonly happen in the wrist, hip, and spine. While men and women of all ages and background can develop osteoporosis, some of the risk factors for osteoporosis are:  Female.  White.  Postmenopausal.  Older adults.  Small in body size.  Eating a diet low in calcium.  Physically inactive.  Smoking.  Use of some medications.  Family history. CALCIUM Calcium is a mineral needed by the body for healthy bones, teeth, and proper function of the heart, muscles, and nerves. The body cannot produce calcium so it must be absorbed through food. Good sources of calcium include:  Dairy products (low fat or nonfat milk, cheese, and yogurt).  Dark green leafy vegetables (bok choy and broccoli).  Calcium fortified foods (orange juice, cereal, bread, soy beverages, and tofu products).  Nuts (almonds). Recommended amounts of calcium vary  for individuals. RECOMMENDED CALCIUM INTAKES Age and Amount in mg per day  Children 1 to 3 years / 700 mg  Children 4 to 8 years / 1,000 mg  Children 9 to 13 years / 1,300 mg  Teens 14 to 18 years / 1,300 mg  Adults 19 to 50 years / 1,000 mg  Adult women 51 to 70 years / 1,200 mg  Adults 71 years and older / 1,200 mg  Pregnant and breastfeeding teens / 1,300 mg  Pregnant and breastfeeding adults / 1,000 mg Vitamin D also plays an important role in healthy bone development. Vitamin D helps in the absorption of calcium. WEIGHT-BEARING PHYSICAL ACTIVITY Regular physical activity has many positive health benefits. Benefits include strong bones. Weight-bearing physical activity early in life is important in reaching peak bone mass. Weight-bearing physical activities cause muscles and bones to work against gravity. Some examples of weight bearing physical activities include:  Walking, jogging, or running.  Field Hockey.  Jumping rope.  Dancing.  Soccer.  Tennis or Racquetball.  Stair climbing.  Basketball.  Hiking.  Weight lifting.  Aerobic fitness classes. Including weight-bearing physical activity into an exercise plan is a great way to keep bones healthy. Adults: Engage in at least 30 minutes of moderate physical activity on most, preferably all, days of the week. Children: Engage in at least 60 minutes of moderate physical activity on most, preferably all, days of the week. FOR MORE INFORMATION [place] Department of Agriculture, Center for Nutrition Policy and Promotion: www.cnpp.usda.gov National Osteoporosis Foundation: www.nof.org Document Released: 07/01/2003 Document Revised: 08/05/2012 Document Reviewed: 09/30/2008 ExitCare Patient Information   2015 [employer]. This information is not intended to replace advice given to you by your health care provider. Make sure you discuss any questions you have with your health care provider.  

## 2014-04-02 NOTE — Progress Notes (Signed)
Taylorann Tkach [birth year] [id number]    History:    Presents for breast and pelvic exam. Normal Pap and mammogram history. 2011 benign breast biopsy. Osteoporosis had been on Fosamax for 3-4 years, request 1 dose. Dr. Toney Rakes recommended to start Forteo, husband diagnosed with brain cancer and did not return to start. History of squamous skin cancer. Current on vaccines.  Past medical history, past surgical history, family history and social history were all reviewed and documented in the EPIC chart. Moved here from [place]. Hobbies golf and gardening. One child lives here, one in [place].  ROS:  A  12 point ROS was performed and pertinent positives and negatives are included.  Exam:  Filed Vitals:   04/02/14 1047  BP: 122/80    General appearance:  Normal Thyroid:  Symmetrical, normal in size, without palpable masses or nodularity. Respiratory  Auscultation:  Clear without wheezing or rhonchi Cardiovascular  Auscultation:  Regular rate, without rubs, murmurs or gallops  Edema/varicosities:  Not grossly evident Abdominal  Soft,nontender, without masses, guarding or rebound.  Liver/spleen:  No organomegaly noted  Hernia:  None appreciated  Skin  Inspection:  Grossly normal   Breasts: Examined lying and sitting.     Right: Without masses, retractions, discharge or axillary adenopathy.     Left: Without masses, retractions, discharge or axillary adenopathy. Gentitourinary   Inguinal/mons:  Normal without inguinal adenopathy  External genitalia:  Normal  BUS/Urethra/Skene's glands:  Normal  Vagina:  Normal  Cervix:  Normal  Uterus:  normal in size, shape and contour.  Midline and mobile  Adnexa/parametria:     Rt: Without masses or tenderness.   Lt: Without masses or tenderness.  Anus and perineum: Normal  Digital rectal exam: Normal sphincter tone without palpated masses or tenderness  Assessment/Plan:  73 y.o. MWF G2P2 for breast and pelvic exam.  Postmenopausal/no  bleeding/no HRT Osteoporosis T score -4.2 at spine Hypercholesteremia-primary care manages labs and meds  Plan: Options reviewed overwhelmed with care of husband other options reviewed will try Prolia information reviewed, has no dental procedures scheduled, will check coverage and return to office for injection.  Home safety and fall prevention and importance of regular weightbearing exercise discussed. SBE's, overdue for mammogram and instructed to schedule, calcium rich diet, vitamin D 2000 daily encouraged. New screening Pap guidelines reviewed.    [place], 5:27 PM 04/02/2014

## 2014-04-28 ENCOUNTER — Telehealth: Payer: Self-pay | Admitting: Gynecology

## 2014-04-28 NOTE — Telephone Encounter (Signed)
Message to patient regarding Prolia and new insurance coverage 2016. Asked patient to return phone call to discuss . She was ok'd for Forteo in March 2015 but due to husband illness never started this. Elon Alas, NP requested check on Prolia benefits.

## 2014-05-04 ENCOUNTER — Ambulatory Visit (INDEPENDENT_AMBULATORY_CARE_PROVIDER_SITE_OTHER): Payer: Medicare Other | Admitting: Ophthalmology

## 2014-05-04 DIAGNOSIS — H43813 Vitreous degeneration, bilateral: Secondary | ICD-10-CM

## 2014-05-04 DIAGNOSIS — D3131 Benign neoplasm of right choroid: Secondary | ICD-10-CM

## 2014-05-04 DIAGNOSIS — H2513 Age-related nuclear cataract, bilateral: Secondary | ICD-10-CM

## 2014-05-04 NOTE — Telephone Encounter (Signed)
Phone call to patient regarding insurance for 2016. Patient states that no change in insurance [employer] Medicare Complete . Insurance information has been sent to Prol[facility] to check benefits. Instructed patient that we will notify her.

## 2014-05-05 ENCOUNTER — Telehealth: Payer: Self-pay | Admitting: Gynecology

## 2014-05-05 NOTE — Telephone Encounter (Signed)
No calcium level drawn at [facility], phone call to pt. PCP ([facility]) had drawn in 10/2013, Calcium level 10.2 patient will bring in copy of labs for Izora Gala to sign off on when she comes in for injection.

## 2014-05-05 NOTE — Telephone Encounter (Signed)
Prolia benefits received. Patient has a 20% co-insurance up to 4900.00 OFP ($0 met) = $ 193.00 and a co-pay of $ 35.00. Her approximate total for the Prolia injection is $228.00  Patient will come in on 05/08/2014 at 9 am for injection.

## 2014-05-08 ENCOUNTER — Other Ambulatory Visit (INDEPENDENT_AMBULATORY_CARE_PROVIDER_SITE_OTHER): Payer: Medicare Other | Admitting: *Deleted

## 2014-05-08 DIAGNOSIS — M810 Age-related osteoporosis without current pathological fracture: Secondary | ICD-10-CM

## 2014-05-08 MED ORDER — DENOSUMAB 60 MG/ML ~~LOC~~ SOLN
60.0000 mg | Freq: Once | SUBCUTANEOUS | Status: AC
  Administered 2014-05-08: 60 mg via SUBCUTANEOUS

## 2014-10-15 ENCOUNTER — Telehealth: Payer: Self-pay | Admitting: Gynecology

## 2014-10-15 NOTE — Telephone Encounter (Addendum)
Phone call left message VM Prolia due after 11/07/14. Submitted request for insurance benefits. Last calcium level at PCP 10/2013 she will need calcium level prior to injection.

## 2014-10-20 ENCOUNTER — Encounter: Payer: Self-pay | Admitting: Women's Health

## 2014-10-20 ENCOUNTER — Ambulatory Visit (INDEPENDENT_AMBULATORY_CARE_PROVIDER_SITE_OTHER): Payer: Medicare Other | Admitting: Women's Health

## 2014-10-20 VITALS — BP 124/80 | Ht 62.0 in | Wt 112.0 lb

## 2014-10-20 DIAGNOSIS — R102 Pelvic and perineal pain: Secondary | ICD-10-CM

## 2014-10-20 DIAGNOSIS — M810 Age-related osteoporosis without current pathological fracture: Secondary | ICD-10-CM | POA: Diagnosis not present

## 2014-10-20 DIAGNOSIS — N9489 Other specified conditions associated with female genital organs and menstrual cycle: Secondary | ICD-10-CM

## 2014-10-20 LAB — CALCIUM: Calcium: 9.3 mg/dL (ref 8.4–10.5)

## 2014-10-20 NOTE — Progress Notes (Signed)
Patient ID: Crystal Tate, female   DOB: 07-14-1940, 74 y.o.   MRN: [id number] Presents with several issues. Reports can sometimes feel bladder pressure with exercise and activity, no pain, burning or urgency. Occasional leakage of urine with coughing and sneezing. Able to exercise most days of the week with no leakage. Nocturia 1. Postmenopausal with no bleeding on no HRT. Reports low abdominal cramping, pressure, twinges of discomfort. Denies constipation or changes in elimination. Has had one dose of Prolia tolerated well, next dose due in July.  03/2013 T score -4.2 at spine, normal calcium level, vitamin D 50. (Prolia treatment was postponed due to husbands illness)  Exam: Tearful most of exam, recent death of husband from brain cancer, hospice helped with his care. External genitalia varicosities left labia, + cystocele with bearing down with no leakage of urine. Bimanual good bladder support, nontender.  Pelvic pressure  osteoporosis on Prolia  Plan: Calcium, return to office in July for Prolia. Ultrasound, will schedule. Continue regular exercise, reassurance given regarding normality of exam and minimal cystocele.

## 2014-10-27 NOTE — Telephone Encounter (Signed)
Phone call : no answer

## 2014-10-28 ENCOUNTER — Ambulatory Visit (INDEPENDENT_AMBULATORY_CARE_PROVIDER_SITE_OTHER): Payer: Medicare Other

## 2014-10-28 ENCOUNTER — Ambulatory Visit (INDEPENDENT_AMBULATORY_CARE_PROVIDER_SITE_OTHER): Payer: Medicare Other | Admitting: Women's Health

## 2014-10-28 ENCOUNTER — Other Ambulatory Visit: Payer: Self-pay | Admitting: Women's Health

## 2014-10-28 VITALS — BP 118/80 | Ht 62.0 in | Wt 112.0 lb

## 2014-10-28 DIAGNOSIS — N858 Other specified noninflammatory disorders of uterus: Secondary | ICD-10-CM

## 2014-10-28 DIAGNOSIS — N9489 Other specified conditions associated with female genital organs and menstrual cycle: Secondary | ICD-10-CM

## 2014-10-28 DIAGNOSIS — R109 Unspecified abdominal pain: Secondary | ICD-10-CM | POA: Diagnosis not present

## 2014-10-28 DIAGNOSIS — R102 Pelvic and perineal pain: Secondary | ICD-10-CM

## 2014-10-28 NOTE — Progress Notes (Signed)
Patient ID: Crystal Tate, female   DOB: October 22, 1940, 74 y.o.   MRN: [id number] Presents for ultrasound. Annual exam had complaint of intermittent pelvic/bladder pressure, no or UTI symptoms other than pressure. Osteoporosis on Prolia, scheduled to return to office after the 16th for second dose. Calcium 9.3.  Exam: Appears well. Ultrasound: T/V and T/A anteverted uterus arcuate homogeneous, fluid-filled endometrial cavity. Endometrium 1.7 mm. Right and left ovary atrophic with normal echo pattern. No apparent masses seen in right or left adnexal. Negative cul-de-sac.  Normal GYN ultrasound  Plan: Reviewed normality of ultrasound, reviewed pressure may be more related to GI. Will watch at this time if symptoms persist or increase will follow-up with GI. Keep scheduled appointment for Prolia. Home safety, fall prevention discussed.

## 2014-11-04 ENCOUNTER — Other Ambulatory Visit: Payer: Medicare Other

## 2014-11-04 NOTE — Telephone Encounter (Signed)
Crystal Tate and I talked with Patient 10/20/14. I explained her benefits for Prolia. No Deductible. Co Insurance 20% ($200), also co pay $35 with or without office visit total approx $235.00. Patient Calcium level 9.3 on 10/20/14, Patient will come in on 11/09/14 for Prolia injection. Next injection due after 05/13/15

## 2014-11-05 ENCOUNTER — Encounter: Payer: Self-pay | Admitting: Gynecology

## 2014-11-09 ENCOUNTER — Other Ambulatory Visit: Payer: Self-pay | Admitting: *Deleted

## 2014-11-09 ENCOUNTER — Ambulatory Visit (INDEPENDENT_AMBULATORY_CARE_PROVIDER_SITE_OTHER): Payer: Medicare Other | Admitting: *Deleted

## 2014-11-09 DIAGNOSIS — M810 Age-related osteoporosis without current pathological fracture: Secondary | ICD-10-CM | POA: Diagnosis not present

## 2014-11-09 MED ORDER — DENOSUMAB 60 MG/ML ~~LOC~~ SOLN
60.0000 mg | Freq: Once | SUBCUTANEOUS | Status: AC
  Administered 2014-11-09: 60 mg via SUBCUTANEOUS

## 2015-03-04 ENCOUNTER — Telehealth: Payer: Self-pay | Admitting: Gynecology

## 2015-03-04 ENCOUNTER — Other Ambulatory Visit: Payer: Self-pay | Admitting: *Deleted

## 2015-03-04 DIAGNOSIS — M810 Age-related osteoporosis without current pathological fracture: Secondary | ICD-10-CM

## 2015-03-04 NOTE — Telephone Encounter (Signed)
Prolia due after 05/12/15. Needs to move complete exam with Izora Gala back one week. Would like to have exam and Prolia at same visit. Bloodwork will be 05/06/15. No change in insurance.

## 2015-04-26 ENCOUNTER — Other Ambulatory Visit: Payer: Self-pay | Admitting: Gynecology

## 2015-04-26 DIAGNOSIS — M810 Age-related osteoporosis without current pathological fracture: Secondary | ICD-10-CM

## 2015-04-27 NOTE — Telephone Encounter (Signed)
Insurance benefit for Prolia Cost to pt approx $400, No deductible, $0 OV, so pay 20% approx $400, NO PA , OOPM $4900 ($0 met for 2017) [employer] AR[employer] medicare . Patient stated she will discuss with Izora Gala 05/13/15 due to increase cost of medication (Prolia). She stated that she may not receive injection.

## 2015-05-04 ENCOUNTER — Encounter: Payer: Self-pay | Admitting: Gynecology

## 2015-05-05 ENCOUNTER — Ambulatory Visit (INDEPENDENT_AMBULATORY_CARE_PROVIDER_SITE_OTHER): Payer: Medicare Other | Admitting: Ophthalmology

## 2015-05-05 DIAGNOSIS — D3131 Benign neoplasm of right choroid: Secondary | ICD-10-CM | POA: Diagnosis not present

## 2015-05-05 DIAGNOSIS — H43813 Vitreous degeneration, bilateral: Secondary | ICD-10-CM | POA: Diagnosis not present

## 2015-05-06 ENCOUNTER — Other Ambulatory Visit: Payer: Medicare Other

## 2015-05-06 ENCOUNTER — Ambulatory Visit (INDEPENDENT_AMBULATORY_CARE_PROVIDER_SITE_OTHER): Payer: Medicare Other

## 2015-05-06 DIAGNOSIS — M810 Age-related osteoporosis without current pathological fracture: Secondary | ICD-10-CM | POA: Diagnosis not present

## 2015-05-06 LAB — CALCIUM: Calcium: 9.4 mg/dL (ref 8.6–10.4)

## 2015-05-12 ENCOUNTER — Encounter: Payer: Self-pay | Admitting: Women's Health

## 2015-05-12 ENCOUNTER — Encounter: Payer: Medicare Other | Admitting: Women's Health

## 2015-05-12 ENCOUNTER — Ambulatory Visit (INDEPENDENT_AMBULATORY_CARE_PROVIDER_SITE_OTHER): Payer: Medicare Other | Admitting: Women's Health

## 2015-05-12 VITALS — BP 110/80 | Wt 114.0 lb

## 2015-05-12 DIAGNOSIS — M810 Age-related osteoporosis without current pathological fracture: Secondary | ICD-10-CM | POA: Diagnosis not present

## 2015-05-12 DIAGNOSIS — Z01419 Encounter for gynecological examination (general) (routine) without abnormal findings: Secondary | ICD-10-CM

## 2015-05-12 MED ORDER — DENOSUMAB 60 MG/ML ~~LOC~~ SOLN
60.0000 mg | Freq: Once | SUBCUTANEOUS | Status: AC
  Administered 2015-05-12: 60 mg via SUBCUTANEOUS

## 2015-05-12 NOTE — Progress Notes (Signed)
Crystal Tate [birth year] [id number]    History:    Presents for breast and pelvic exam. Postmenopausal on no HRT with no bleeding. Osteoporosis on Prolia. 05/06/2015 T score -3.6 at spine and improvement from -4.2. Has had 4 doses of Prolia. Had one dose of reclast. Had been on Fosamax for 3-4 years in the past with no improvement.  2009-08-13 benign breast biopsy. History of squamous cell skin cancer. Current on immunizations.  Past medical history, past surgical history, family history and social history were all reviewed and documented in the EPIC chart. Husband died of brain cancer 14-Aug-2014. Has been attendings hospice support group. Has one daughter who lives close by, other child lives in [place]. Originally from [place].  ROS:  A ROS was performed and pertinent positives and negatives are included.  Exam:  Filed Vitals:   05/12/15 1205  BP: 110/80    General appearance:  Normal Thyroid:  Symmetrical, normal in size, without palpable masses or nodularity. Respiratory  Auscultation:  Clear without wheezing or rhonchi Cardiovascular  Auscultation:  Regular rate, without rubs, murmurs or gallops  Edema/varicosities:  Not grossly evident Abdominal  Soft,nontender, without masses, guarding or rebound.  Liver/spleen:  No organomegaly noted  Hernia:  None appreciated  Skin  Inspection:  Grossly normal   Breasts: Examined lying and sitting.     Right: Without masses, retractions, discharge or axillary adenopathy.     Left: Without masses, retractions, discharge or axillary adenopathy. Gentitourinary   Inguinal/mons:  Normal without inguinal adenopathy  External genitalia:  Normal  BUS/Urethra/Skene's glands:  Normal  Vagina:  Normal  Cervix:  Normal  Uterus:   normal in size, shape and contour.  Midline and mobile  Adnexa/parametria:     Rt: Without masses or tenderness.   Lt: Without masses or tenderness.  Anus and perineum: Normal  Digital rectal exam: Normal sphincter tone  without palpated masses or tenderness  Assessment/Plan:  75 y.o. W WF G2 P2 for breast and pelvic exam.  Postmenopausal/no bleeding/no HRT Osteoporosis on Prolia -third dose today History of squamous cell skin cancer annual skin checks Hypercholesterolemia meds and labs-primary care  Plan: Reviewed importance of annual screening mammogram has not had one for several years instructed to schedule. SBE's, exercise, home safety, fall prevention and importance of weightbearing exercise reviewed. Prolia 60 mg subcutaneous every 6 months. Reviewed best to stay on 5 years. Continue annual skin checks, sunscreens. Continue calcium and vitamin D supplement last vitamin D level 51, normal calcium levels 09/2014.  [place], 1:58 PM 05/12/2015

## 2015-05-12 NOTE — Patient Instructions (Addendum)
Menopause is a normal process in which your reproductive ability comes to an end. This process happens gradually over a span of months to years, usually between the ages of 48 and 55. Menopause is complete when you have missed 12 consecutive menstrual periods. It is important to talk with your health care provider about some of the most common conditions that affect postmenopausal women, such as heart disease, cancer, and bone loss (osteoporosis). Adopting a healthy lifestyle and getting preventive care can help to promote your health and wellness. Those actions can also lower your chances of developing some of these common conditions. WHAT SHOULD I KNOW ABOUT MENOPAUSE? During menopause, you may experience a number of symptoms, such as:  Moderate-to-severe hot flashes.  Night sweats.  Decrease in sex drive.  Mood swings.  Headaches.  Tiredness.  Irritability.  Memory problems.  Insomnia. Choosing to treat or not to treat menopausal changes is an individual decision that you make with your health care provider. WHAT SHOULD I KNOW ABOUT HORMONE REPLACEMENT THERAPY AND SUPPLEMENTS? Hormone therapy products are effective for treating symptoms that are associated with menopause, such as hot flashes and night sweats. Hormone replacement carries certain risks, especially as you become older. If you are thinking about using estrogen or estrogen with progestin treatments, discuss the benefits and risks with your health care provider. WHAT SHOULD I KNOW ABOUT HEART DISEASE AND STROKE? Heart disease, heart attack, and stroke become more likely as you age. This may be due, in part, to the hormonal changes that your body experiences during menopause. These can affect how your body processes dietary fats, triglycerides, and cholesterol. Heart attack and stroke are both medical emergencies. There are many things that you can do to help prevent heart disease and stroke:  Have your blood pressure  checked at least every 1-2 years. High blood pressure causes heart disease and increases the risk of stroke.  If you are 55-79 years old, ask your health care provider if you should take aspirin to prevent a heart attack or a stroke.  Do not use any tobacco products, including cigarettes, chewing tobacco, or electronic cigarettes. If you need help quitting, ask your health care provider.  It is important to eat a healthy diet and maintain a healthy weight.  Be sure to include plenty of vegetables, fruits, low-fat dairy products, and lean protein.  Avoid eating foods that are high in solid fats, added sugars, or salt (sodium).  Get regular exercise. This is one of the most important things that you can do for your health.  Try to exercise for at least 150 minutes each week. The type of exercise that you do should increase your heart rate and make you sweat. This is known as moderate-intensity exercise.  Try to do strengthening exercises at least twice each week. Do these in addition to the moderate-intensity exercise.  Know your numbers.Ask your health care provider to check your cholesterol and your blood glucose. Continue to have your blood tested as directed by your health care provider. WHAT SHOULD I KNOW ABOUT CANCER SCREENING? There are several types of cancer. Take the following steps to reduce your risk and to catch any cancer development as early as possible. Breast Cancer  Practice breast self-awareness.  This means understanding how your breasts normally appear and feel.  It also means doing regular breast self-exams. Let your health care provider know about any changes, no matter how small.  If you are 40 or older, have a clinician do a   breast exam (clinical breast exam or CBE) every year. Depending on your age, family history, and medical history, it may be recommended that you also have a yearly breast X-ray (mammogram).  If you have a family history of breast cancer,  talk with your health care provider about genetic screening.  If you are at high risk for breast cancer, talk with your health care provider about having an MRI and a mammogram every year.  Breast cancer (BRCA) gene test is recommended for women who have family members with BRCA-related cancers. Results of the assessment will determine the need for genetic counseling and BRCA1 and for BRCA2 testing. BRCA-related cancers include these types:  Breast. This occurs in males or females.  Ovarian.  Tubal. This may also be called fallopian tube cancer.  Cancer of the abdominal or pelvic lining (peritoneal cancer).  Prostate.  Pancreatic. Cervical, Uterine, and Ovarian Cancer Your health care provider may recommend that you be screened regularly for cancer of the pelvic organs. These include your ovaries, uterus, and vagina. This screening involves a pelvic exam, which includes checking for microscopic changes to the surface of your cervix (Pap test).  For women ages 21-65, health care providers may recommend a pelvic exam and a Pap test every three years. For women ages 77-65, they may recommend the Pap test and pelvic exam, combined with testing for human papilloma virus (HPV), every five years. Some types of HPV increase your risk of cervical cancer. Testing for HPV may also be done on women of any age who have unclear Pap test results.  Other health care providers may not recommend any screening for nonpregnant women who are considered low risk for pelvic cancer and have no symptoms. Ask your health care provider if a screening pelvic exam is right for you.  If you have had past treatment for cervical cancer or a condition that could lead to cancer, you need Pap tests and screening for cancer for at least 20 years after your treatment. If Pap tests have been discontinued for you, your risk factors (such as having a new sexual partner) need to be reassessed to determine if you should start having  screenings again. Some women have medical problems that increase the chance of getting cervical cancer. In these cases, your health care provider may recommend that you have screening and Pap tests more often.  If you have a family history of uterine cancer or ovarian cancer, talk with your health care provider about genetic screening.  If you have vaginal bleeding after reaching menopause, tell your health care provider.  There are currently no reliable tests available to screen for ovarian cancer. Lung Cancer Lung cancer screening is recommended for adults 3-70 years old who are at high risk for lung cancer because of a history of smoking. A yearly low-dose CT scan of the lungs is recommended if you:  Currently smoke.  Have a history of at least 30 pack-years of smoking and you currently smoke or have quit within the past 15 years. A pack-year is smoking an average of one pack of cigarettes per day for one year. Yearly screening should:  Continue until it has been 15 years since you quit.  Stop if you develop a health problem that would prevent you from having lung cancer treatment. Colorectal Cancer  This type of cancer can be detected and can often be prevented.  Routine colorectal cancer screening usually begins at age 38 and continues through age 12.  If you have  risk factors for colon cancer, your health care provider may recommend that you be screened at an earlier age.  If you have a family history of colorectal cancer, talk with your health care provider about genetic screening.  Your health care provider may also recommend using home test kits to check for hidden blood in your stool.  A small camera at the end of a tube can be used to examine your colon directly (sigmoidoscopy or colonoscopy). This is done to check for the earliest forms of colorectal cancer.  Direct examination of the colon should be repeated every 5-10 years until age 67. However, if early forms of  precancerous polyps or small growths are found or if you have a family history or genetic risk for colorectal cancer, you may need to be screened more often. Skin Cancer  Check your skin from head to toe regularly.  Monitor any moles. Be sure to tell your health care provider:  About any new moles or changes in moles, especially if there is a change in a mole's shape or color.  If you have a mole that is larger than the size of a pencil eraser.  If any of your family members has a history of skin cancer, especially at a Trachelle Low age, talk with your health care provider about genetic screening.  Always use sunscreen. Apply sunscreen liberally and repeatedly throughout the day.  Whenever you are outside, protect yourself by wearing long sleeves, pants, a wide-brimmed hat, and sunglasses. WHAT SHOULD I KNOW ABOUT OSTEOPOROSIS? Osteoporosis is a condition in which bone destruction happens more quickly than new bone creation. After menopause, you may be at an increased risk for osteoporosis. To help prevent osteoporosis or the bone fractures that can happen because of osteoporosis, the following is recommended:  If you are 39-61 years old, get at least 1,000 mg of calcium and at least 600 mg of vitamin D per day.  If you are older than age 16 but younger than age 7, get at least 1,200 mg of calcium and at least 600 mg of vitamin D per day.  If you are older than age 47, get at least 1,200 mg of calcium and at least 800 mg of vitamin D per day. Smoking and excessive alcohol intake increase the risk of osteoporosis. Eat foods that are rich in calcium and vitamin D, and do weight-bearing exercises several times each week as directed by your health care provider. WHAT SHOULD I KNOW ABOUT HOW MENOPAUSE AFFECTS Crystal Tate? Depression may occur at any age, but it is more common as you become older. Common symptoms of depression include:  Low or sad mood.  Changes in sleep patterns.  Changes  in appetite or eating patterns.  Feeling an overall lack of motivation or enjoyment of activities that you previously enjoyed.  Frequent crying spells. Talk with your health care provider if you think that you are experiencing depression. WHAT SHOULD I KNOW ABOUT IMMUNIZATIONS? It is important that you get and maintain your immunizations. These include:  Tetanus, diphtheria, and pertussis (Tdap) booster vaccine.  Influenza every year before the flu season begins.  Pneumonia vaccine.  Shingles vaccine. Your health care provider may also recommend other immunizations.   This information is not intended to replace advice given to you by your health care provider. Make sure you discuss any questions you have with your health care provider.   Document Released: 06/02/2005 Document Revised: 05/01/2014 Document Reviewed: 12/11/2013 Elsevier Interactive Patient Education 2016 [employer]  Osteoporosis Osteoporosis is the thinning and loss of density in the bones. Osteoporosis makes the bones more brittle, fragile, and likely to break (fracture). Over time, osteoporosis can cause the bones to become so weak that they fracture after a simple fall. The bones most likely to fracture are the bones in the hip, wrist, and spine. CAUSES  The exact cause is not known. RISK FACTORS Anyone can develop osteoporosis. You may be at greater risk if you have a family history of the condition or have poor nutrition. You may also have a higher risk if you are:   Female.   50 years old or older.  A smoker.  Not physically active.   White or Asian.  Slender. SIGNS AND SYMPTOMS  A fracture might be the first sign of the disease, especially if it results from a fall or injury that would not usually cause a bone to break. Other signs and symptoms include:   Low back and neck pain.  Stooped posture.  Height loss. DIAGNOSIS  To make a diagnosis, your health care provider may:  Take a medical  history.  Perform a physical exam.  Order tests, such as:  A bone mineral density test.  A dual-energy X-ray absorptiometry test. TREATMENT  The goal of osteoporosis treatment is to strengthen your bones to reduce your risk of a fracture. Treatment may involve:  Making lifestyle changes, such as:  Eating a diet rich in calcium.  Doing weight-bearing and muscle-strengthening exercises.  Stopping tobacco use.  Limiting alcohol intake.  Taking medicine to slow the process of bone loss or to increase bone density.  Monitoring your levels of calcium and vitamin D. HOME CARE INSTRUCTIONS  Include calcium and vitamin D in your diet. Calcium is important for bone health, and vitamin D helps the body absorb calcium.  Perform weight-bearing and muscle-strengthening exercises as directed by your health care provider.  Do not use any tobacco products, including cigarettes, chewing tobacco, and electronic cigarettes. If you need help quitting, ask your health care provider.  Limit your alcohol intake.  Take medicines only as directed by your health care provider.  Keep all follow-up visits as directed by your health care provider. This is important.  Take precautions at home to lower your risk of falling, such as:  Keeping rooms well lit and clutter free.  Installing safety rails on stairs.  Using rubber mats in the bathroom and other areas that are often wet or slippery. SEEK IMMEDIATE MEDICAL CARE IF:  You fall or injure yourself.    This information is not intended to replace advice given to you by your health care provider. Make sure you discuss any questions you have with your health care provider.   Document Released: 01/18/2005 Document Revised: 05/01/2014 Document Reviewed: 09/18/2013 Elsevier Interactive Patient Education 2016 Elsevier Inc.  

## 2015-05-13 ENCOUNTER — Encounter: Payer: Medicare Other | Admitting: Women's Health

## 2015-05-19 ENCOUNTER — Encounter: Payer: Medicare Other | Admitting: Women's Health

## 2015-05-20 NOTE — Telephone Encounter (Signed)
Prolia given on 05/12/2015 per [employer]on Alas, patient states she will continue injections until she is unable to afford the cost. Next injection due after 11/09/2015. Calcium level 10/20/2014 9.3

## 2015-06-03 ENCOUNTER — Other Ambulatory Visit: Payer: Self-pay

## 2015-06-03 DIAGNOSIS — Z1231 Encounter for screening mammogram for malignant neoplasm of breast: Secondary | ICD-10-CM

## 2015-06-14 ENCOUNTER — Ambulatory Visit
Admission: RE | Admit: 2015-06-14 | Discharge: 2015-06-14 | Disposition: A | Discharge status: Home / Self Care | Payer: Medicare Other | Source: Ambulatory Visit

## 2015-06-14 DIAGNOSIS — Z1231 Encounter for screening mammogram for malignant neoplasm of breast: Secondary | ICD-10-CM

## 2015-10-19 ENCOUNTER — Telehealth: Payer: Self-pay | Admitting: Gynecology

## 2015-10-19 NOTE — Telephone Encounter (Signed)
Prolia due after 11/09/15

## 2015-10-29 NOTE — Telephone Encounter (Signed)
PROLIA APPOINTMENT 11/16/15 AT  2 30 PM nurse only. Insurance benefits are No deductible, No co pay , No co insurance , OOPM [id number] met). Calcium 9.4 on 05/06/15   Cost for pt $0

## 2015-11-01 ENCOUNTER — Encounter: Payer: Self-pay | Admitting: Women's Health

## 2015-11-10 ENCOUNTER — Ambulatory Visit (INDEPENDENT_AMBULATORY_CARE_PROVIDER_SITE_OTHER): Payer: Medicare Other | Admitting: Anesthesiology

## 2015-11-10 DIAGNOSIS — M810 Age-related osteoporosis without current pathological fracture: Secondary | ICD-10-CM | POA: Diagnosis not present

## 2015-11-10 MED ORDER — DENOSUMAB 60 MG/ML ~~LOC~~ SOLN
60.0000 mg | Freq: Once | SUBCUTANEOUS | Status: AC
  Administered 2015-11-10: 60 mg via SUBCUTANEOUS

## 2015-11-16 ENCOUNTER — Ambulatory Visit: Payer: Medicare Other

## 2015-11-16 NOTE — Telephone Encounter (Signed)
Prolia given 11/10/15 next injection due after 05/13/16

## 2016-04-13 ENCOUNTER — Telehealth: Payer: Self-pay | Admitting: Gynecology

## 2016-04-13 DIAGNOSIS — M810 Age-related osteoporosis without current pathological fracture: Secondary | ICD-10-CM

## 2016-04-13 NOTE — Telephone Encounter (Signed)
Lm on home and mobile to call me in 2018 to schedule prolia

## 2016-04-25 ENCOUNTER — Telehealth: Payer: Self-pay | Admitting: Gynecology

## 2016-04-25 NOTE — Telephone Encounter (Signed)
Second telephone encounter , primary encounter has notes.

## 2016-04-27 NOTE — Telephone Encounter (Signed)
Prolia due after 05/12/16.  Calcium level was thru PCP patient will find the results, Complete with [place] due  05/11/16 . Pt has no insurance change. We will check insurance and she will call back with blood work and schedule Prolia and complete on same day.

## 2016-05-08 ENCOUNTER — Ambulatory Visit (INDEPENDENT_AMBULATORY_CARE_PROVIDER_SITE_OTHER): Payer: Medicare Other | Admitting: Ophthalmology

## 2016-05-22 NOTE — Telephone Encounter (Signed)
PC to pt no answer, No appointment made by pt for complete exam with [place]. Calcium level 06/18/15  10.0  .  Prolia due after 05/19/2016

## 2016-05-24 NOTE — Telephone Encounter (Addendum)
Left message labs ok, appointment 06/08/16 with Izora Gala. Will also ask Izora Gala about Calcium level to be drawn last calcium level 06/18/15

## 2016-05-25 NOTE — Telephone Encounter (Signed)
Phone call, states will have lab work done at her primary care and will have calcium level drawn there, will get Prolia at her annual exam time here.

## 2016-06-01 ENCOUNTER — Ambulatory Visit (INDEPENDENT_AMBULATORY_CARE_PROVIDER_SITE_OTHER): Payer: Medicare Other | Admitting: Ophthalmology

## 2016-06-06 ENCOUNTER — Encounter: Payer: Medicare Other | Admitting: Women's Health

## 2016-06-08 ENCOUNTER — Encounter: Payer: Self-pay | Admitting: Women's Health

## 2016-06-08 ENCOUNTER — Ambulatory Visit (INDEPENDENT_AMBULATORY_CARE_PROVIDER_SITE_OTHER): Payer: Medicare Other | Admitting: Women's Health

## 2016-06-08 VITALS — BP 106/68 | Ht 62.25 in | Wt 118.0 lb

## 2016-06-08 DIAGNOSIS — M810 Age-related osteoporosis without current pathological fracture: Secondary | ICD-10-CM | POA: Diagnosis not present

## 2016-06-08 DIAGNOSIS — Z01419 Encounter for gynecological examination (general) (routine) without abnormal findings: Secondary | ICD-10-CM

## 2016-06-08 MED ORDER — DENOSUMAB 60 MG/ML ~~LOC~~ SOLN
60.0000 mg | Freq: Once | SUBCUTANEOUS | Status: AC
  Administered 2016-06-08: 60 mg via SUBCUTANEOUS

## 2016-06-08 NOTE — Telephone Encounter (Signed)
Prolia given today next injection after 12/08/16

## 2016-06-08 NOTE — Progress Notes (Signed)
Crystal Tate 12-29-1940 [id number]    History:    Presents for breast and pelvic exam. On Prolia with spine  improvement since 2015. 04/2015 spine -3.6 improved from 4.2 left dose of Prolia July 2016.  Had completed 3 or 4 years of Fosamax and one dose of Reclast in [place]/ moved here from [place]. Has annual skin checks, history of squamous skin cancer. Hypercholesteremia managed by primary care. Negative colonoscopy. Current on vaccines.  Past medical history, past surgical history, family history and social history were all reviewed and documented in the EPIC chart. Husband died of brain cancer 1-1/2 years ago,  lives on the same street as son, other child lives in [place]. Becoming more active with a group of friends, doing chair yoga.  ROS:  A ROS was performed and pertinent positives and negatives are included.  Exam:  Vitals:   06/08/16 1104  BP: 106/68  Weight: 118 lb (53.5 kg)  Height: 5' 2.25" (1.581 m)   Body mass index is 21.41 kg/m.   General appearance:  Normal Thyroid:  Symmetrical, normal in size, without palpable masses or nodularity. Respiratory  Auscultation:  Clear without wheezing or rhonchi Cardiovascular  Auscultation:  Regular rate, without rubs, murmurs or gallops  Edema/varicosities:  Not grossly evident Abdominal  Soft,nontender, without masses, guarding or rebound.  Liver/spleen:  No organomegaly noted  Hernia:  None appreciated  Skin  Inspection:  Grossly normal   Breasts: Examined lying and sitting.     Right: Without masses, retractions, discharge or axillary adenopathy.     Left: Without masses, retractions, discharge or axillary adenopathy. Gentitourinary   Inguinal/mons:  Normal without inguinal adenopathy  External genitalia:  Normal  BUS/Urethra/Skene's glands:  Normal  Vagina:  Atrophic  Cervix:  Normal  Uterus:   normal in size, shape and contour.  Midline and mobile  Adnexa/parametria:     Rt: Without masses or tenderness.   Lt: Without masses  or tenderness.  Anus and perineum: Normal  Digital rectal exam: Normal sphincter tone without palpated masses or tenderness  Assessment/Plan:  76 y.o. W WF G2 P2 for breast and pelvic exam.   Osteoporosis on Prolia with improvement /4th dose of Prolia today Hypercholesterolemia primary care manages meds and labs Squamous  color cancer-dermatologist manages  Plan: SBE's, continue annual mammogram due this month instructed to schedule. Will repeat DEXA next year. Continue same dosage over-the-counter vitamin D. Home safety, fall prevention and importance of continuing weightbearing exercise and yoga reviewed. Prolia 60 mg subcutaneous every 6 months reviewed will continue for 5 year.. Has completed 2 years. Continue annual skin checks.     Huel Cote [facility], 12:11 PM 06/08/2016

## 2016-06-08 NOTE — Patient Instructions (Signed)

## 2016-06-22 ENCOUNTER — Other Ambulatory Visit: Payer: Self-pay | Admitting: Women's Health

## 2016-06-22 DIAGNOSIS — Z1231 Encounter for screening mammogram for malignant neoplasm of breast: Secondary | ICD-10-CM

## 2016-06-29 ENCOUNTER — Ambulatory Visit (INDEPENDENT_AMBULATORY_CARE_PROVIDER_SITE_OTHER): Payer: Medicare Other | Admitting: Ophthalmology

## 2016-06-29 DIAGNOSIS — H43813 Vitreous degeneration, bilateral: Secondary | ICD-10-CM | POA: Diagnosis not present

## 2016-06-29 DIAGNOSIS — H2513 Age-related nuclear cataract, bilateral: Secondary | ICD-10-CM | POA: Diagnosis not present

## 2016-06-29 DIAGNOSIS — D3131 Benign neoplasm of right choroid: Secondary | ICD-10-CM

## 2016-07-13 ENCOUNTER — Ambulatory Visit
Admission: RE | Admit: 2016-07-13 | Discharge: 2016-07-13 | Disposition: A | Discharge status: Home / Self Care | Payer: Medicare Other | Source: Ambulatory Visit | Attending: Women's Health | Admitting: Women's Health

## 2016-07-13 DIAGNOSIS — Z1231 Encounter for screening mammogram for malignant neoplasm of breast: Secondary | ICD-10-CM

## 2016-07-14 ENCOUNTER — Other Ambulatory Visit: Payer: Self-pay | Admitting: Women's Health

## 2016-07-14 DIAGNOSIS — R928 Other abnormal and inconclusive findings on diagnostic imaging of breast: Secondary | ICD-10-CM

## 2016-07-17 ENCOUNTER — Telehealth: Payer: Self-pay | Admitting: *Deleted

## 2016-07-17 NOTE — Telephone Encounter (Signed)
Pt called with questing about a call back from the breast center regarding mammogram screening,was told to call and schedule additional imaging due to possible right breast mass. Pt will call and schedule with breast center

## 2016-08-08 ENCOUNTER — Ambulatory Visit
Admission: RE | Admit: 2016-08-08 | Discharge: 2016-08-08 | Disposition: A | Discharge status: Home / Self Care | Payer: Medicare Other | Source: Ambulatory Visit | Attending: Women's Health | Admitting: Women's Health

## 2016-08-08 ENCOUNTER — Other Ambulatory Visit: Payer: Self-pay | Admitting: Women's Health

## 2016-08-08 DIAGNOSIS — N6310 Unspecified lump in the right breast, unspecified quadrant: Secondary | ICD-10-CM

## 2016-08-08 DIAGNOSIS — R928 Other abnormal and inconclusive findings on diagnostic imaging of breast: Secondary | ICD-10-CM

## 2016-08-09 ENCOUNTER — Ambulatory Visit
Admission: RE | Admit: 2016-08-09 | Discharge: 2016-08-09 | Disposition: A | Discharge status: Home / Self Care | Payer: Medicare Other | Source: Ambulatory Visit | Attending: Women's Health | Admitting: Women's Health

## 2016-08-09 ENCOUNTER — Other Ambulatory Visit: Payer: Self-pay | Admitting: Women's Health

## 2016-08-09 DIAGNOSIS — N6310 Unspecified lump in the right breast, unspecified quadrant: Secondary | ICD-10-CM

## 2016-08-22 ENCOUNTER — Ambulatory Visit: Payer: Self-pay | Admitting: Surgery

## 2016-08-22 DIAGNOSIS — C50919 Malignant neoplasm of unspecified site of unspecified female breast: Secondary | ICD-10-CM

## 2016-08-22 HISTORY — DX: Malignant neoplasm of unspecified site of unspecified female breast: C50.919

## 2016-09-01 ENCOUNTER — Encounter: Payer: Self-pay | Admitting: Hematology and Oncology

## 2016-09-01 ENCOUNTER — Encounter: Payer: Self-pay | Admitting: *Deleted

## 2016-09-01 ENCOUNTER — Encounter: Payer: Self-pay | Admitting: Radiation Oncology

## 2016-09-01 ENCOUNTER — Ambulatory Visit (HOSPITAL_BASED_OUTPATIENT_CLINIC_OR_DEPARTMENT_OTHER): Payer: Medicare Other | Admitting: Hematology and Oncology

## 2016-09-01 DIAGNOSIS — Z170 Estrogen receptor positive status [ER+]: Secondary | ICD-10-CM

## 2016-09-01 DIAGNOSIS — C50411 Malignant neoplasm of upper-outer quadrant of right female breast: Secondary | ICD-10-CM | POA: Diagnosis not present

## 2016-09-01 NOTE — Progress Notes (Signed)
[facility] NOTE  Patient Care Team: Nicola Girt, DO as PCP - General (Internal Medicine) Nicola Girt, DO  CHIEF COMPLAINTS/PURPOSE OF CONSULTATION:  Newly diagnosed breast cancer  HISTORY OF PRESENTING ILLNESS:  Crystal Tate 76 y.o. female is here because of recent diagnosis of right breast cancer. 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 She was seen by Dr. Brantley Stage with general surgery with a plan to undergo lumpectomy. She was sent to [place] for discussion regarding adjuvant treatment options. She is here today accompanied by her son.  I reviewed her records extensively and collaborated the history with the patient.  SUMMARY OF ONCOLOGIC HISTORY:   Malignant neoplasm of upper-outer quadrant of right breast in female, estrogen receptor positive ([place])   08/09/2016 Initial Diagnosis    Right breast mass 11:00 position: 1.7 x 1.2 x 1.4 cm, no axillary lymph nodes; biopsy revealed invasive ductal carcinoma ER 100%, PR 100%, Ki-67 10%, HER-2 negative ratio 1.27, grade 1-2, T1c N0 stage IA clinical stage       MEDICAL HISTORY:  Past Medical History:  Diagnosis Date  . Cancer (HCC)    SQUAMOS CELL , LEFT LEG  . Heart burn   . High cholesterol     SURGICAL HISTORY: Past Surgical History:  Procedure Laterality Date  . INNER EAR SURGERY     LEFT..   . SKIN CANCER EXCISION  2010   LEFT LEG, SQUAMOS CELL,  . TONSILLECTOMY AND ADENOIDECTOMY    . TUBAL LIGATION    . VEIN SURGERY      SOCIAL HISTORY: Social History   Social History  . Marital status: Widowed    Spouse name: N/A  . Number of children: N/A  . Years of education: N/A   Occupational History  . Not on file.   Social History Main Topics  .  Smoking status: Never Smoker  . Smokeless tobacco: Never Used  . Alcohol use 4.2 oz/week    7 Glasses of wine per week     Comment: GLASS OF WINE DAILY  . Drug use: No  . Sexual activity: Yes    Birth control/ protection: None, Post-menopausal   Other Topics Concern  . Not on file   Social History Narrative  . No narrative on file    FAMILY HISTORY: Family History  Problem Relation Age of Onset  . Cancer Father        ORAL   . Cancer Son 71       MELANOMA  . Breast cancer Neg Hx     ALLERGIES:  is allergic to other.  MEDICATIONS:  Current Outpatient Prescriptions  Medication Sig Dispense Refill  . aspirin 81 MG tablet Take 81 mg by mouth daily.      . Biotin (PA BIOTIN) 1000 MCG tablet Take 1,000 mcg by mouth 3 (three) times daily.      . calcium citrate-vitamin D (CITRACAL+D) 315-200 MG-UNIT per tablet Take 1 tablet by mouth 2 (two) times daily.    . Cyanocobalamin (VITAMIN B-12 PO) Take 1 tablet by mouth daily.    . fish oil-omega-3 fatty acids 1000 MG capsule Take 2 g by mouth daily.      . Flaxseed, Linseed, (FLAX SEEDS PO) Take 1 tablet by mouth daily.     Marland Kitchen  omeprazole (PRILOSEC) 20 MG capsule Take 20 mg by mouth daily.      . pravastatin (PRAVACHOL) 10 MG tablet Take 10 mg by mouth daily.      . valACYclovir (VALTREX) 1000 MG tablet Take 1 tablet by mouth as needed.   0   No current facility-administered medications for this visit.     REVIEW OF SYSTEMS:   Constitutional: Denies fevers, chills or abnormal night sweats Eyes: Denies blurriness of vision, double vision or watery eyes Ears, nose, mouth, throat, and face: Denies mucositis or sore throat Respiratory: Denies cough, dyspnea or wheezes Cardiovascular: Denies palpitation, chest discomfort or lower extremity swelling Gastrointestinal:  Denies nausea, heartburn or change in bowel habits Skin: Denies abnormal skin rashes Lymphatics: Denies new lymphadenopathy or easy bruising Neurological:Denies  numbness, tingling or new weaknesses Behavioral/Psych: Mood is stable, no new changes  Breast:  Denies any palpable lumps or discharge All other systems were reviewed with the patient and are negative.  PHYSICAL EXAMINATION: ECOG PERFORMANCE STATUS: 0 - Asymptomatic  Vitals:   09/01/16 1109  BP: (!) 122/58  Pulse: 67  Resp: 16  Temp: 98.4 F (36.9 C)   Filed Weights   09/01/16 1109  Weight: 111 lb 8 oz (50.6 kg)    GENERAL:alert, no distress and comfortable SKIN: skin color, texture, turgor are normal, no rashes or significant lesions EYES: normal, conjunctiva are pink and non-injected, sclera clear OROPHARYNX:no exudate, no erythema and lips, buccal mucosa, and tongue normal  NECK: supple, thyroid normal size, non-tender, without nodularity LYMPH:  no palpable lymphadenopathy in the cervical, axillary or inguinal LUNGS: clear to auscultation and percussion with normal breathing effort HEART: regular rate & rhythm and no murmurs and no lower extremity edema ABDOMEN:abdomen soft, non-tender and normal bowel sounds Musculoskeletal:no cyanosis of digits and no clubbing  PSYCH: alert & oriented x 3 with fluent speech NEURO: no focal motor/sensory deficits BREAST: No palpable nodules in breast. Bruising from the recent biopsy No palpable axillary or supraclavicular lymphadenopathy (exam performed in the presence of a chaperone)   RADIOGRAPHIC STUDIES: I have personally reviewed the radiological reports and agreed with the findings in the report.  ASSESSMENT AND PLAN:  Malignant neoplasm of upper-outer quadrant of right breast in female, estrogen receptor positive ([place]) 08/09/2016 Right breast mass 11:00 position: 1.7 x 1.2 x 1.4 cm, no axillary lymph nodes; biopsy revealed invasive ductal carcinoma ER 100%, PR 100%, Ki-67 10%, HER-2 negative ratio 1.27, grade 1-2, T1c N0 stage IA clinical stage  Pathology and radiology counseling:Discussed with the patient, the details of  pathology including the type of breast cancer,the clinical staging, the significance of ER, PR and HER-2/neu receptors and the implications for treatment. After reviewing the pathology in detail, we proceeded to discuss the different treatment options between surgery, radiation, chemotherapy, antiestrogen therapies.  Recommendations: 1. Breast conserving surgery followed by 2. Oncotype DX testing to determine if chemotherapy would be of any benefit followed by 3. Adjuvant radiation therapy followed by 4. Adjuvant antiestrogen therapy  Oncotype counseling: I discussed Oncotype DX test. I explained to the patient that this is a 21 gene panel to evaluate patient tumors DNA to calculate recurrence score. This would help determine whether patient has high risk or intermediate risk or low risk breast cancer. She understands that if her tumor was found to be high risk, she would benefit from systemic chemotherapy. If low risk, no need of chemotherapy. If she was found to be intermediate risk, we would need to evaluate the  score as well as other risk factors and determine if an abbreviated chemotherapy may be of benefit.  Return to clinic after surgery to discuss final pathology report and then determine if Oncotype DX testing will need to be sent.  All questions were answered. The patient knows to call the clinic with any problems, questions or concerns.    Rulon Eisenmenger, MD 09/01/16

## 2016-09-01 NOTE — Assessment & Plan Note (Signed)
08/09/2016 Right breast mass 11:00 position: 1.7 x 1.2 x 1.4 cm, no axillary lymph nodes; biopsy revealed invasive ductal carcinoma ER 100%, PR 100%, Ki-67 10%, HER-2 negative ratio 1.27, grade 1-2, T1c N0 stage IA clinical stage  Pathology and radiology counseling:Discussed with the patient, the details of pathology including the type of breast cancer,the clinical staging, the significance of ER, PR and HER-2/neu receptors and the implications for treatment. After reviewing the pathology in detail, we proceeded to discuss the different treatment options between surgery, radiation, chemotherapy, antiestrogen therapies.  Recommendations: 1. Breast conserving surgery followed by 2. Oncotype DX testing to determine if chemotherapy would be of any benefit followed by 3. Adjuvant radiation therapy followed by 4. Adjuvant antiestrogen therapy  Oncotype counseling: I discussed Oncotype DX test. I explained to the patient that this is a 21 gene panel to evaluate patient tumors DNA to calculate recurrence score. This would help determine whether patient has high risk or intermediate risk or low risk breast cancer. She understands that if her tumor was found to be high risk, she would benefit from systemic chemotherapy. If low risk, no need of chemotherapy. If she was found to be intermediate risk, we would need to evaluate the score as well as other risk factors and determine if an abbreviated chemotherapy may be of benefit.  Return to clinic after surgery to discuss final pathology report and then determine if Oncotype DX testing will need to be sent.

## 2016-09-04 ENCOUNTER — Telehealth: Payer: Self-pay | Admitting: Hematology and Oncology

## 2016-09-04 NOTE — Progress Notes (Signed)
Location of Breast Cancer: Right Breast  11:00 position Upper Outer Quadrant  Histology per Pathology Report: Diagnosis 4/18/218: Breast, right, needle core biopsy, 11 o'clock - INVASIVE DUCTAL CARCINOMA  Receptor Status: ER(100%+), PR (100%+), Her2-neu (neg ratio=1.27), Ki-67(10%)  Did patient present with symptoms (if so, please note symptoms) or was this found on screening mammography?: Routine screening,   Past/Anticipated interventions by surgeon, if any: Dr. Donnal Moat scheduled 5/24/218  Past/Anticipated interventions by medical oncology, if any: Chemotherapy : Dr. Lindi Adie, MD seen 09/01/2016   Lymphedema issues, if any: NO  Pain issues, if any:  No  SAFETY ISSUES: NO  Prior radiation? NO  Pacemaker/ICD? NO Is the patient on methotrexate? NO Current Complaints / other details:  HX Squamous cell ,left leg,Widowed, no tobqaco use, drinks 7 glasses wine per week,  Father oral cancer, Son Melanoma BP 109/65 (BP Location: Left Arm, Patient Position: Sitting, Cuff Size: Normal)   Pulse 64   Temp 98.9 F (37.2 C) (Oral)   Resp 16   Ht '5\' 2"'  (1.575 m)   Wt 110 lb 9.6 oz (50.2 kg)   LMP 02/28/1999   BMI 20.23 kg/m   Wt Readings from Last 3 Encounters:  09/06/16 110 lb 9.6 oz (50.2 kg)  09/01/16 111 lb 8 oz (50.6 kg)  06/08/16 118 lb (53.5 kg)      Rebecca Eaton, RN 09/04/2016,12:48 PM

## 2016-09-04 NOTE — Telephone Encounter (Signed)
lvm to inform ptof 5/31 appt per sch msg

## 2016-09-05 ENCOUNTER — Ambulatory Visit: Payer: Self-pay | Admitting: Surgery

## 2016-09-05 ENCOUNTER — Other Ambulatory Visit: Payer: Self-pay | Admitting: Surgery

## 2016-09-05 DIAGNOSIS — Z170 Estrogen receptor positive status [ER+]: Principal | ICD-10-CM

## 2016-09-05 DIAGNOSIS — C50411 Malignant neoplasm of upper-outer quadrant of right female breast: Secondary | ICD-10-CM

## 2016-09-05 NOTE — H&P (Signed)
Crystal Tate 08/22/2016 3:24 PM Location: [facility] Patient #: [id number] DOB: [birth year] Widowed / Language: Cleophus Molt / Race: White Female   History of Present Illness Crystal Moores A. Nicky Kras MD; 08/22/2016 5:26 PM) Patient words: Patient sent at the request of Dr. Noel Tate for mammographic abnormality. The patient went for screening mammogram and a density was found in the right breast. This measured 1.7 cm in maximal diameter in her axilla was normal. Core biopsy was done. 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 Patient denies any history of breast pain, breast mass or nipple discharge.                   Patient recalled from screening for right breast mass.  EXAM: 2D DIGITAL DIAGNOSTIC RIGHT MAMMOGRAM WITH CAD AND ADJUNCT TOMO  ULTRASOUND RIGHT BREAST  COMPARISON: Previous exam(s).  ACR Breast Density Category b: There are scattered areas of fibroglandular density.  FINDINGS: Irregular mass within the upper-outer right breast. Biopsy marking clip within the lateral right breast from prior remote biopsy. No additional concerning masses or areas of calcification within the right breast.  Mammographic images were processed with CAD.  On physical exam, I palpate a small mass within the upper-outer right breast.  Targeted ultrasound is performed, showing a 1.7 x 1.2 x 1.4 cm irregular mixed echogenicity predominately hypoechoic mass right breast 11 o'clock position 5 cm from the nipple, corresponding with mammographic abnormality. No right axillary adenopathy.  IMPRESSION: Suspicious right breast mass 11 o'clock position.  RECOMMENDATION: Ultrasound-guided core needle biopsy suspicious right breast mass 11 o'clock position.  I have discussed the findings and recommendations with the patient. Results were also provided in writing at the conclusion of the visit. If applicable, a reminder letter will be  sent to the patient regarding the next appointment.  BI-RADS CATEGORY 4: Suspicious.   Electronically Signed By: Crystal Tate M.D.              ADDITIONAL INFORMATION: PROGNOSTIC INDICATORS Results: IMMUNOHISTOCHEMICAL AND MORPHOMETRIC ANALYSIS PERFORMED MANUALLY Estrogen Receptor: 100%, POSITIVE, STRONG STAINING INTENSITY Progesterone Receptor: 100%, POSITIVE, STRONG STAINING INTENSITY Proliferation Marker Ki67: 10% REFERENCE RANGE ESTROGEN RECEPTOR NEGATIVE 0% POSITIVE =>1% REFERENCE RANGE PROGESTERONE RECEPTOR NEGATIVE 0% POSITIVE =>1% All controls stained appropriately Crystal Males MD Pathologist, Electronic Signature ( Signed 08/15/2016) FLUORESCENCE IN-SITU HYBRIDIZATION Results: HER2 - NEGATIVE RATIO OF HER2/CEP17 SIGNALS 1.27 AVERAGE HER2 COPY NUMBER PER CELL 2.55 Reference Range: NEGATIVE HER2/CEP17 Ratio <2.0 and average HER2 copy number <4.0 EQUIVOCAL HER2/CEP17 Ratio <2.0 and average HER2 copy number >=4.0 and <6.0 1 of 3 FINAL for Crystal Tate ([id number]) ADDITIONAL INFORMATION:(continued) POSITIVE HER2/CEP17 Ratio >=2.0 or <2.0 and average HER2 copy number >=6.0 Crystal Laws MD Pathologist, Electronic Signature ( Signed 08/16/2016) FINAL DIAGNOSIS Diagnosis Breast, right, needle core biopsy, 11 o'clock - INVASIVE DUCTAL CARCINOMA. - SEE COMMENT. Microscopic Comment The carcinoma appears grade 1-2.  The patient is a 76 year old female.   Past Surgical History (Crystal Tate, CMA; 08/22/2016 3:24 PM) Breast Biopsy  Bilateral. Tonsillectomy   Diagnostic Studies History (Crystal Tate, CMA; 08/22/2016 3:24 PM) Mammogram  within last year Pap Smear  1-5 years ago  Allergies (Crystal Tate, CMA; 08/22/2016 3:25 PM) No Known Drug Allergies 08/22/2016 Allergies Reconciled   Medication History (Crystal Tate, CMA; 08/22/2016 3:28 PM) ValACYclovir HCl (500MG Tablet, Oral) Active. Aspirin (81MG Capsule, Oral) Active. Biotin (10MG  Capsule, Oral) Active. Calcium Citrate + D (250-200MG-UNIT Tablet, Oral) Active. Cyanocobalamin (100MCG Lozenge, Oral) Active. Fish  Oil + D3 (1000-1000MG-UNIT Capsule, Oral) Active. Flaxseed (Linseed) (1000MG Capsule, Oral) Active. Omeprazole (20MG Capsule DR, Oral) Active. Medications Reconciled  Social History (Crystal Tate, CMA; 08/22/2016 3:24 PM) Alcohol use  Moderate alcohol use. Caffeine use  Carbonated beverages, Coffee, Tea. No drug use  Tobacco use  Never smoker.  Family History (Crystal Tate, CMA; 08/22/2016 3:24 PM) Cancer  Father. Melanoma  Son.  Pregnancy / Birth History (Crystal Tate, CMA; 08/22/2016 3:24 PM) Age at menarche  89 years. Age of menopause  70-50 Contraceptive History  Intrauterine device, Oral contraceptives. Gravida  2 Maternal age  71-25 Para  2  Other Problems (Crystal Tate, CMA; 08/22/2016 3:24 PM) Breast Cancer  Gastroesophageal Reflux Disease  Lump In Breast     Review of Systems (Crystal Tate CMA; 08/22/2016 3:24 PM) General Not Present- Appetite Loss, Chills, Fatigue, Fever, Night Sweats, Weight Gain and Weight Loss. Skin Not Present- Change in Wart/Mole, Dryness, Hives, Jaundice, New Lesions, Non-Healing Wounds, Rash and Ulcer. HEENT Present- Ringing in the Ears. Not Present- Earache, Hearing Loss, Hoarseness, Nose Bleed, Oral Ulcers, Seasonal Allergies, Sinus Pain, Sore Throat, Visual Disturbances, Wears glasses/contact lenses and Yellow Eyes. Breast Present- Breast Mass. Not Present- Breast Pain, Nipple Discharge and Skin Changes. Cardiovascular Not Present- Chest Pain, Difficulty Breathing Lying Down, Leg Cramps, Palpitations, Rapid Heart Rate, Shortness of Breath and Swelling of Extremities. Gastrointestinal Not Present- Abdominal Pain, Bloating, Bloody Stool, Change in Bowel Habits, Chronic diarrhea, Constipation, Difficulty Swallowing, Excessive gas, Gets full quickly at meals, Hemorrhoids, Indigestion, Nausea,  Rectal Pain and Vomiting. Female Genitourinary Present- Urgency. Not Present- Frequency, Nocturia, Painful Urination and Pelvic Pain. Musculoskeletal Not Present- Back Pain, Joint Pain, Joint Stiffness, Muscle Pain, Muscle Weakness and Swelling of Extremities. Neurological Present- Decreased Memory. Not Present- Fainting, Headaches, Numbness, Seizures, Tingling, Tremor, Trouble walking and Weakness. Endocrine Present- Hot flashes. Not Present- Cold Intolerance, Excessive Hunger, Hair Changes, Heat Intolerance and New Diabetes. Hematology Not Present- Blood Thinners, Easy Bruising, Excessive bleeding, Gland problems, HIV and Persistent Infections.  Vitals (Crystal Tate CMA; 08/22/2016 3:29 PM) 08/22/2016 3:28 PM Weight: 111.8 lb Height: 62in Body Surface Area: 1.49 m Body Mass Index: 20.45 kg/m  Temp.: 97.70F  Pulse: 70 (Regular)  BP: 110/60 (Sitting, Left Arm, Standard)       Physical Exam (Yuvin Bussiere A. Journiee Feldkamp MD; 08/22/2016 5:24 PM) General Mental Status-Alert. General Appearance-Consistent with stated age. Hydration-Well hydrated. Voice-Normal.  Head and Neck Head-normocephalic, atraumatic with no lesions or palpable masses. Trachea-midline. Thyroid Gland Characteristics - normal size and consistency.  Eye Eyeball - Bilateral-Extraocular movements intact. Sclera/Conjunctiva - Bilateral-No scleral icterus.  Breast Breast - Left-Symmetric, Non Tender, No Biopsy scars, no Dimpling, No Inflammation, No Lumpectomy scars, No Mastectomy scars, No Peau d' Orange. Breast - Right-Symmetric, Non Tender, No Biopsy scars, no Dimpling, No Inflammation, No Lumpectomy scars, No Mastectomy scars, No Peau d' Orange. Breast Lump-No Palpable Breast Mass. Note: BRUISING RIGHT BREAST NOTED   Cardiovascular Cardiovascular examination reveals -normal heart sounds, regular rate and rhythm with no murmurs and normal pedal pulses  bilaterally.  Neurologic Neurologic evaluation reveals -alert and oriented x 3 with no impairment of recent or remote memory. Mental Status-Normal.  Musculoskeletal Normal Exam - Left-Upper Extremity Strength Normal and Lower Extremity Strength Normal. Normal Exam - Right-Upper Extremity Strength Normal and Lower Extremity Strength Normal.  Lymphatic Head & Neck  General Head & Neck Lymphatics: Bilateral - Description - Normal. Axillary  General Axillary Region: Bilateral - Description - Normal. Tenderness - Non Tender.  Assessment & Plan (Nikolaj Geraghty A. Varsha Knock MD; 08/22/2016 5:26 PM) BREAST CANCER, RIGHT (C50.911) Impression: Discussed options of breast conservation versus mastectomy with reconstruction. The patient is opted for right breast partial mastectomy with sentinel lymph node mapping. Risk of lumpectomy include bleeding, infection, seroma, more surgery, use of seed/wire, wound care, cosmetic deformity and the need for other treatments, death , blood clots, death. Pt agrees to proceed. Risk of sentinel lymph node mapping include bleeding, infection, lymphedema, shoulder pain. stiffness, dye allergy. cosmetic deformity , blood clots, death, need for more surgery. Pt agres to proceed. Current Plans Referred to Oncology, for evaluation and follow up (Oncology). Routine. Referred to Radiation Oncology, for evaluation and follow up (Radiation Oncology). Routine. Referred to Physical Therapy, for evaluation and follow up (Physical Therapy). Routine. You are being scheduled for surgery- Our schedulers will call you.  You should hear from our office's scheduling department within 5 working days about the location, date, and time of surgery. We try to make accommodations for patient's preferences in scheduling surgery, but sometimes the OR schedule or the surgeon's schedule prevents [place] from making those accommodations.  If you have not heard from our office [phone]) in  5 working days, call the office and ask for your surgeon's nurse.  If you have other questions about your diagnosis, plan, or surgery, call the office and ask for your surgeon's nurse.  Pt Education - [facility] Breast Cancer Information Given - [facility] "Breast Journey" Package We discussed the staging and pathophysiology of breast cancer. We discussed all of the different options for treatment for breast cancer including surgery, chemotherapy, radiation therapy, Herceptin, and antiestrogen therapy. We discussed a sentinel lymph node biopsy as she does not appear to having lymph node involvement right now. We discussed the performance of that with injection of radioactive tracer and blue dye. We discussed that she would have an incision underneath her axillary hairline. We discussed that there is a bout a 10-20% chance of having a positive node with a sentinel lymph node biopsy and we will await the permanent pathology to make any other first further decisions in terms of her treatment. One of these options might be to return to the operating room to perform an axillary lymph node dissection. We discussed about a 1-2% risk lifetime of chronic shoulder pain as well as lymphedema associated with a sentinel lymph node biopsy. We discussed the options for treatment of the breast cancer which included lumpectomy versus a mastectomy. We discussed the performance of the lumpectomy with a wire placement. We discussed a 10-20% chance of a positive margin requiring reexcision in the operating room. We also discussed that she may need radiation therapy or antiestrogen therapy or both if she undergoes lumpectomy. We discussed the mastectomy and the postoperative care for that as well. We discussed that there is no difference in her survival whether she undergoes lumpectomy with radiation therapy or antiestrogen therapy versus a mastectomy. There is a slight difference in the local recurrence rate being 3-5% with lumpectomy and  about 1% with a mastectomy. We discussed the risks of operation including bleeding, infection, possible reoperation. She understands her further therapy will be based on what her stages at the time of her operation.  Pt Education - [facility] breast cancer surgery: discussed with patient and provided information. Pt Education - [facility] Breast Biopsy HCI: discussed with patient and provided information. Pt Education - [facility] (After Breast Cancer) Class Info: discussed with patient and provided information.   Signed by Joyice Faster  Cornett, MD (08/22/2016 5:31 PM) 

## 2016-09-06 ENCOUNTER — Ambulatory Visit
Admission: RE | Admit: 2016-09-06 | Discharge: 2016-09-06 | Disposition: A | Discharge status: Home / Self Care | Payer: Medicare Other | Source: Ambulatory Visit | Attending: Radiation Oncology | Admitting: Radiation Oncology

## 2016-09-06 ENCOUNTER — Encounter: Payer: Self-pay | Admitting: Gynecology

## 2016-09-06 ENCOUNTER — Encounter: Payer: Self-pay | Admitting: Radiation Oncology

## 2016-09-06 VITALS — BP 109/65 | HR 64 | Temp 98.9°F | Resp 16 | Ht 62.0 in | Wt 110.6 lb

## 2016-09-06 DIAGNOSIS — Z79899 Other long term (current) drug therapy: Secondary | ICD-10-CM | POA: Insufficient documentation

## 2016-09-06 DIAGNOSIS — Z170 Estrogen receptor positive status [ER+]: Secondary | ICD-10-CM | POA: Insufficient documentation

## 2016-09-06 DIAGNOSIS — Z85828 Personal history of other malignant neoplasm of skin: Secondary | ICD-10-CM | POA: Insufficient documentation

## 2016-09-06 DIAGNOSIS — Z9889 Other specified postprocedural states: Secondary | ICD-10-CM | POA: Diagnosis not present

## 2016-09-06 DIAGNOSIS — Z7982 Long term (current) use of aspirin: Secondary | ICD-10-CM | POA: Diagnosis not present

## 2016-09-06 DIAGNOSIS — Z803 Family history of malignant neoplasm of breast: Secondary | ICD-10-CM | POA: Diagnosis not present

## 2016-09-06 DIAGNOSIS — C50411 Malignant neoplasm of upper-outer quadrant of right female breast: Secondary | ICD-10-CM | POA: Insufficient documentation

## 2016-09-06 DIAGNOSIS — E7800 Pure hypercholesterolemia, unspecified: Secondary | ICD-10-CM | POA: Diagnosis not present

## 2016-09-06 NOTE — Progress Notes (Signed)
Radiation Oncology         [phone] ________________________________  Name: Crystal Tate MRN: [id number]  Date: 09/06/2016  DOB: [birth year]  MW:[id number], Barbarann Ehlers, DO  Erroll Luna, MD     REFERRING PHYSICIAN: Erroll Luna, MD   DIAGNOSIS: The encounter diagnosis was Malignant neoplasm of upper-outer quadrant of right female breast, unspecified estrogen receptor status ([place]).   HISTORY OF PRESENT ILLNESS: Crystal Tate is a 76 y.o. female seen at the request of Dr. Brantley Stage for a new diagnosis of breast cancer. The patient underwent a routine screening mammogram on 07/13/16. This revealed a possible mass in the right breast warranting further evaluation. Ultrasound on 08/08/16 showed a 1.7 x 1.2 x 1.4 cm irregular mass at the 11 o'clock position, 5 cm from the nipple in the upper outer quadrant of the right breast. 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 Receptor status was ER 100%, PR 100%, HER2 -, and Ki67 of 10%. Patient was seen by Dr. Lindi Adie on 09/01/16. Per his note, he recommends the patient proceed with breast conserving surgery, followed by oncotype dx testing to determine the role of chemotherapy, followed by adjuvant radiation therapy, concluding with adjuvant antiestrogen therapy. The patient is scheduled to undergo lumpectomy with sentinel node assessment on 09/14/16. She presents today to discuss the role of radiation as part of her treatment.    PREVIOUS RADIATION THERAPY: No   PAST MEDICAL HISTORY:  Past Medical History:  Diagnosis Date  . Cancer (HCC)    SQUAMOS CELL , LEFT LEG  . Heart burn   . High cholesterol        PAST SURGICAL HISTORY: Past Surgical History:  Procedure Laterality Date  . INNER EAR SURGERY     LEFT..   . SKIN CANCER EXCISION  2010   LEFT LEG, SQUAMOS CELL,  . TONSILLECTOMY AND ADENOIDECTOMY    . TUBAL LIGATION    . VEIN SURGERY       FAMILY HISTORY:  Family History    Problem Relation Age of Onset  . Cancer Father        ORAL   . Cancer Son 59       MELANOMA  . Breast cancer Neg Hx      SOCIAL HISTORY:  reports that she has never smoked. She has never used smokeless tobacco. She reports that she drinks about 4.2 oz of alcohol per week . She reports that she does not use drugs. The patient is widowed and lives in [place]. 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   ALLERGIES: Other   MEDICATIONS:  Current Outpatient Prescriptions  Medication Sig Dispense Refill  . aspirin 81 MG tablet Take 81 mg by mouth daily.      . Biotin (PA BIOTIN) 1000 MCG tablet Take 5,000 mcg by mouth daily.     . calcium citrate-vitamin D (CITRACAL+D) 315-200 MG-UNIT per tablet Take 1 tablet by mouth 2 (two) times daily.    . Cyanocobalamin (VITAMIN B-12 PO) Take 1 tablet by mouth daily.    . fish oil-omega-3 fatty acids 1000 MG capsule Take 2 g by mouth daily.      . Flaxseed, Linseed, (FLAX SEEDS PO) Take 1 tablet by mouth daily.     Marland Kitchen omeprazole (PRILOSEC) 20 MG capsule Take 20 mg by mouth daily.      . valACYclovir (VALTREX) 1000  MG tablet Take 1 tablet by mouth as needed.   0  . pravastatin (PRAVACHOL) 10 MG tablet Take 10 mg by mouth daily.       No current facility-administered medications for this encounter.      REVIEW OF SYSTEMS: On review of systems, the patient reports that she is doing well overall. She denies any chest pain, shortness of breath, cough, fevers, chills, night sweats, unintended weight changes. She denies any bowel or bladder disturbances, and denies abdominal pain, nausea or vomiting. She denies any new musculoskeletal or joint aches or pains. A complete review of systems is obtained and is otherwise negative.     PHYSICAL EXAM:  Wt Readings from Last 3 Encounters:  09/06/16 110 lb 9.6 oz (50.2 kg)  09/01/16 111 lb 8 oz (50.6 kg)  06/08/16 118 lb (53.5 kg)   Temp Readings from Last 3  Encounters:  09/06/16 98.9 F (37.2 C) (Oral)  09/01/16 98.4 F (36.9 C) (Oral)  05/08/11 98.6 F (37 C) (Oral)   BP Readings from Last 3 Encounters:  09/06/16 109/65  09/01/16 (!) 122/58  06/08/16 106/68   Pulse Readings from Last 3 Encounters:  09/06/16 64  09/01/16 67  05/08/11 82   Pain Assessment Pain Score: 0-No pain/10  In general this is a well appearing caucasian woman in no acute distress. She is alert and oriented x4 and appropriate throughout the examination. HEENT reveals that the patient is normocephalic, atraumatic. EOMs are intact. PERRLA. Skin is intact without any evidence of gross lesions. Cardiovascular exam reveals a regular rate and rhythm, no clicks rubs or murmurs are auscultated. Chest is clear to auscultation bilaterally. Breast exam shows puckering of the right breast deep to the biopsy site. There is thickening deep to this consistent with her tumor. No palpable mass in the left breast. No nipple discharge or bleeding in bilateral breasts. Lymphatic assessment is performed and does not reveal any adenopathy in the cervical, supraclavicular, axillary, or inguinal chains. Abdomen has active bowel sounds in all quadrants and is intact. The abdomen is soft, non tender, non distended. Lower extremities are negative for pretibial pitting edema, deep calf tenderness, cyanosis or clubbing.   ECOG = 0  0 - Asymptomatic (Fully active, able to carry on all predisease activities without restriction)  1 - Symptomatic but completely ambulatory (Restricted in physically strenuous activity but ambulatory and able to carry out work of a light or sedentary nature. For example, light housework, office work)  2 - Symptomatic, <50% in bed during the day (Ambulatory and capable of all self care but unable to carry out any work activities. Up and about more than 50% of waking hours)  3 - Symptomatic, >50% in bed, but not bedbound (Capable of only limited self-care, confined to  bed or chair 50% or more of waking hours)  4 - Bedbound (Completely disabled. Cannot carry on any self-care. Totally confined to bed or chair)  5 - Death   Eustace Pen MM, Creech RH, Tormey DC, et al. [phone]). "Toxicity and response criteria of the [facility]". [place] Oncol. 5 (6): 649-55    LABORATORY DATA:  No results found for: WBC, HGB, HCT, MCV, PLT No results found for: NA, K, CL, CO2 No results found for: ALT, AST, GGT, ALKPHOS, BILITOT    RADIOGRAPHY: US Breast Ltd Uni Right Inc Axilla  Result Date: 08/08/2016 CLINICAL DATA:  Patient recalled from screening for right breast mass. EXAM: 2D DIGITAL DIAGNOSTIC RIGHT MAMMOGRAM  WITH CAD AND ADJUNCT TOMO ULTRASOUND RIGHT BREAST COMPARISON:  Previous exam(s). ACR Breast Density Category b: There are scattered areas of fibroglandular density. FINDINGS: Irregular mass within the upper-outer right breast. Biopsy marking clip within the lateral right breast from prior remote biopsy. No additional concerning masses or areas of calcification within the right breast. Mammographic images were processed with CAD. On physical exam, I palpate a small mass within the upper-outer right breast. Targeted ultrasound is performed, showing a 1.7 x 1.2 x 1.4 cm irregular mixed echogenicity predominately hypoechoic mass right breast 11 o'clock position 5 cm from the nipple, corresponding with mammographic abnormality. No right axillary adenopathy. IMPRESSION: Suspicious right breast mass 11 o'clock position. RECOMMENDATION: Ultrasound-guided core needle biopsy suspicious right breast mass 11 o'clock position. I have discussed the findings and recommendations with the patient. Results were also provided in writing at the conclusion of the visit. If applicable, a reminder letter will be sent to the patient regarding the next appointment. BI-RADS CATEGORY  4: Suspicious. Electronically Signed   By: Lovey Newcomer M.D.   On: 08/08/2016 14:55   Mm  Diag Breast Tomo Uni Right  Result Date: 08/08/2016 CLINICAL DATA:  Patient recalled from screening for right breast mass. EXAM: 2D DIGITAL DIAGNOSTIC RIGHT MAMMOGRAM WITH CAD AND ADJUNCT TOMO ULTRASOUND RIGHT BREAST COMPARISON:  Previous exam(s). ACR Breast Density Category b: There are scattered areas of fibroglandular density. FINDINGS: Irregular mass within the upper-outer right breast. Biopsy marking clip within the lateral right breast from prior remote biopsy. No additional concerning masses or areas of calcification within the right breast. Mammographic images were processed with CAD. On physical exam, I palpate a small mass within the upper-outer right breast. Targeted ultrasound is performed, showing a 1.7 x 1.2 x 1.4 cm irregular mixed echogenicity predominately hypoechoic mass right breast 11 o'clock position 5 cm from the nipple, corresponding with mammographic abnormality. No right axillary adenopathy. IMPRESSION: Suspicious right breast mass 11 o'clock position. RECOMMENDATION: Ultrasound-guided core needle biopsy suspicious right breast mass 11 o'clock position. I have discussed the findings and recommendations with the patient. Results were also provided in writing at the conclusion of the visit. If applicable, a reminder letter will be sent to the patient regarding the next appointment. BI-RADS CATEGORY  4: Suspicious. Electronically Signed   By: Lovey Newcomer M.D.   On: 08/08/2016 14:55   Mm Clip Placement Right  Result Date: 08/09/2016 CLINICAL DATA:  Status post ultrasound-guided core needle biopsy of a 1.7 cm mass in the 11 o'clock position of the right breast. EXAM: DIAGNOSTIC RIGHT MAMMOGRAM POST ULTRASOUND BIOPSY COMPARISON:  Previous exam(s). FINDINGS: Mammographic images were obtained following ultrasound guided biopsy of the recently demonstrated 1.7 cm mass in the 11 o'clock position of the right breast. These demonstrate a ribbon shaped biopsy marker clip within the biopsied mass.  This is superior, lateral and posterior to a previously placed U shaped clip. IMPRESSION: Appropriate clip deployment following right breast ultrasound-guided core needle biopsy. Final Assessment: Post Procedure Mammograms for Marker Placement Electronically Signed   By: Claudie Revering M.D.   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 This was found to be concordant by Dr. Claudie Revering. Pathology results were discussed with the patient by telephone. The patient reported doing well after the biopsy with  tenderness at the site. Post biopsy instructions and care were reviewed and questions were answered. The patient was encouraged to call [facility] for any additional concerns. The patient requested surgical consultation in [place], [place], and this has been scheduled with Dr. Erroll Luna at [facility] on Aug 22, 2016 Pathology results reported by Susa Raring RN, BSN on 08/11/2016. Electronically Signed   By: Claudie Revering M.D.   On: 08/11/2016 14:01   Result Date: 08/12/2016 CLINICAL DATA:  1.7 cm irregular mass in the 11 o'clock position of the right breast at recent mammography and ultrasound. EXAM: ULTRASOUND GUIDED RIGHT BREAST CORE NEEDLE BIOPSY COMPARISON:  Previous exam(s). FINDINGS: I met with the patient and we discussed the procedure of ultrasound-guided biopsy, including benefits and alternatives. We discussed the high likelihood of a successful procedure. We discussed the risks of the procedure, including infection, bleeding, tissue injury, clip migration, and inadequate sampling. Informed written consent was given. The usual time-out protocol was performed immediately prior to the procedure. Lesion quadrant: Right upper outer quadrant Using sterile  technique and 1% Lidocaine as local anesthetic, under direct ultrasound visualization, a 12 gauge spring-loaded device was used to perform biopsy of the recently demonstrated 1.7 cm mass in the 11 o'clock position of the right breast, 5 cm from the nipple, using a caudal approach. At the conclusion of the procedure a ribbon shaped tissue marker clip was deployed into the biopsy cavity. Follow up 2 view mammogram was performed and dictated separately. IMPRESSION: Ultrasound guided biopsy of a 1.7 cm mass in the 11 o'clock position of the right breast. No apparent complications. Electronically Signed: By: Claudie Revering M.D. On: 08/09/2016 13:19       IMPRESSION/PLAN: 1. 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 Dr. Lisbeth Renshaw discusses the pathology findings and reviews the nature of invasive breast disease. She is planning breast conservation with lumpectomy with sentinel mapping.Oncotype testing will be performed and will determine a role for chemotherapy. Provided that chemotherapy is not indicated, the patient's course would then be followed by external radiotherapy to the breast followed by antiestrogen therapy. We discussed the risks, benefits, short, and long term effects of radiotherapy, and the patient is interested in proceeding. Dr. Lisbeth Renshaw discusses the delivery and logistics of radiotherapy and would offer the patient a course of 4 weeks of treatment. We will see her back about 2 weeks after surgery to move forward with the simulation and planning process and anticipate starting radiotherapy about 4 weeks after surgery.   The above documentation reflects my direct findings during this shared patient visit. Please see the separate note by Dr. Lisbeth Renshaw on this date for the remainder of the patient's plan of care.    Carola Rhine, PAC  This document serves as a record of services personally performed by Kyung Rudd, MD and  Shona Simpson, PA-C. It was created on their behalf by Bethann Humble, a trained medical scribe. The creation of this record is based on the scribe's personal observations and the provider's statements to them. This document has been checked and approved by the attending provider.

## 2016-09-06 NOTE — Progress Notes (Signed)
Please see the Nurse Progress Note in the MD Initial Consult Encounter for this patient. 

## 2016-09-07 ENCOUNTER — Encounter (HOSPITAL_BASED_OUTPATIENT_CLINIC_OR_DEPARTMENT_OTHER): Payer: Self-pay | Admitting: *Deleted

## 2016-09-08 ENCOUNTER — Encounter (HOSPITAL_BASED_OUTPATIENT_CLINIC_OR_DEPARTMENT_OTHER)
Admission: RE | Admit: 2016-09-08 | Discharge: 2016-09-08 | Disposition: A | Discharge status: Home / Self Care | Payer: Medicare Other | Source: Ambulatory Visit | Attending: Surgery | Admitting: Surgery

## 2016-09-08 DIAGNOSIS — Z170 Estrogen receptor positive status [ER+]: Secondary | ICD-10-CM | POA: Diagnosis present

## 2016-09-08 DIAGNOSIS — C50411 Malignant neoplasm of upper-outer quadrant of right female breast: Secondary | ICD-10-CM | POA: Insufficient documentation

## 2016-09-08 LAB — COMPREHENSIVE METABOLIC PANEL
ALT: 19 U/L (ref 14–54)
ANION GAP: 7 (ref 5–15)
AST: 20 U/L (ref 15–41)
Albumin: 4.1 g/dL (ref 3.5–5.0)
Alkaline Phosphatase: 30 U/L — ABNORMAL LOW (ref 38–126)
BUN: 9 mg/dL (ref 6–20)
CALCIUM: 9.3 mg/dL (ref 8.9–10.3)
CHLORIDE: 104 mmol/L (ref 101–111)
CO2: 28 mmol/L (ref 22–32)
Creatinine, Ser: 0.79 mg/dL (ref 0.44–1.00)
GFR calc non Af Amer: >60 mL/min (ref >60)
Glucose, Bld: 110 mg/dL — ABNORMAL HIGH (ref 65–99)
Potassium: 4.5 mmol/L (ref 3.5–5.1)
SODIUM: 139 mmol/L (ref 135–145)
Total Bilirubin: 0.5 mg/dL (ref 0.3–1.2)
Total Protein: 6.9 g/dL (ref 6.5–8.1)

## 2016-09-08 LAB — CBC WITH DIFFERENTIAL/PLATELET
Basophils Absolute: 0 10*3/uL (ref 0.0–0.1)
Basophils Relative: 0 %
EOS ABS: 0 10*3/uL (ref 0.0–0.7)
EOS PCT: 1 %
HCT: 42.4 % (ref 36.0–46.0)
Hemoglobin: 13.8 g/dL (ref 12.0–15.0)
LYMPHS ABS: 1.7 10*3/uL (ref 0.7–4.0)
Lymphocytes Relative: 36 %
MCH: 31.4 pg (ref 26.0–34.0)
MCHC: 32.5 g/dL (ref 30.0–36.0)
MCV: 96.4 fL (ref 78.0–100.0)
MONOS PCT: 11 %
Monocytes Absolute: 0.6 10*3/uL (ref 0.1–1.0)
Neutro Abs: 2.5 10*3/uL (ref 1.7–7.7)
Neutrophils Relative %: 52 %
PLATELETS: 332 10*3/uL (ref 150–400)
RBC: 4.4 MIL/uL (ref 3.87–5.11)
RDW: 13.7 % (ref 11.5–15.5)
WBC: 4.8 10*3/uL (ref 4.0–10.5)

## 2016-09-08 NOTE — Progress Notes (Signed)
Pt given Boost drink and instructed to drink by 0730 day of surgery with teach back method.

## 2016-09-11 ENCOUNTER — Ambulatory Visit
Admission: RE | Admit: 2016-09-11 | Discharge: 2016-09-11 | Disposition: A | Discharge status: Home / Self Care | Payer: Medicare Other | Source: Ambulatory Visit | Attending: Surgery | Admitting: Surgery

## 2016-09-11 DIAGNOSIS — C50411 Malignant neoplasm of upper-outer quadrant of right female breast: Secondary | ICD-10-CM

## 2016-09-11 DIAGNOSIS — Z170 Estrogen receptor positive status [ER+]: Principal | ICD-10-CM

## 2016-09-12 ENCOUNTER — Telehealth: Payer: Self-pay

## 2016-09-12 NOTE — Telephone Encounter (Signed)
Called and left a message with a new appt due to out of office  Crystal Tate

## 2016-09-14 ENCOUNTER — Ambulatory Visit (HOSPITAL_BASED_OUTPATIENT_CLINIC_OR_DEPARTMENT_OTHER): Payer: Medicare Other | Admitting: Certified Registered Nurse Anesthetist

## 2016-09-14 ENCOUNTER — Encounter (HOSPITAL_BASED_OUTPATIENT_CLINIC_OR_DEPARTMENT_OTHER): Payer: Self-pay | Admitting: *Deleted

## 2016-09-14 ENCOUNTER — Ambulatory Visit
Admission: RE | Admit: 2016-09-14 | Discharge: 2016-09-14 | Disposition: A | Discharge status: Home / Self Care | Payer: Medicare Other | Source: Ambulatory Visit | Attending: Surgery | Admitting: Surgery

## 2016-09-14 ENCOUNTER — Ambulatory Visit (HOSPITAL_BASED_OUTPATIENT_CLINIC_OR_DEPARTMENT_OTHER)
Admission: RE | Admit: 2016-09-14 | Discharge: 2016-09-14 | Disposition: A | Discharge status: Home / Self Care | Payer: Medicare Other | Source: Ambulatory Visit | Attending: Surgery | Admitting: Surgery

## 2016-09-14 ENCOUNTER — Encounter (HOSPITAL_COMMUNITY)
Admission: RE | Admit: 2016-09-14 | Discharge: 2016-09-14 | Disposition: A | Discharge status: Home / Self Care | Payer: Medicare Other | Source: Ambulatory Visit | Attending: Surgery | Admitting: Surgery

## 2016-09-14 ENCOUNTER — Encounter (HOSPITAL_BASED_OUTPATIENT_CLINIC_OR_DEPARTMENT_OTHER)
Admission: RE | Disposition: A | Discharge status: Home / Self Care | Payer: Self-pay | Source: Ambulatory Visit | Attending: Surgery

## 2016-09-14 DIAGNOSIS — Z79899 Other long term (current) drug therapy: Secondary | ICD-10-CM | POA: Insufficient documentation

## 2016-09-14 DIAGNOSIS — C50411 Malignant neoplasm of upper-outer quadrant of right female breast: Secondary | ICD-10-CM

## 2016-09-14 DIAGNOSIS — Z170 Estrogen receptor positive status [ER+]: Secondary | ICD-10-CM | POA: Diagnosis not present

## 2016-09-14 DIAGNOSIS — K219 Gastro-esophageal reflux disease without esophagitis: Secondary | ICD-10-CM | POA: Diagnosis not present

## 2016-09-14 DIAGNOSIS — Z7982 Long term (current) use of aspirin: Secondary | ICD-10-CM | POA: Diagnosis not present

## 2016-09-14 DIAGNOSIS — Z809 Family history of malignant neoplasm, unspecified: Secondary | ICD-10-CM | POA: Insufficient documentation

## 2016-09-14 HISTORY — PX: BREAST LUMPECTOMY: SHX2

## 2016-09-14 HISTORY — DX: Prediabetes: R73.03

## 2016-09-14 HISTORY — DX: Other complications of anesthesia, initial encounter: T88.59XA

## 2016-09-14 HISTORY — DX: Headache, unspecified: R51.9

## 2016-09-14 HISTORY — DX: Unspecified osteoarthritis, unspecified site: M19.90

## 2016-09-14 HISTORY — DX: Nausea with vomiting, unspecified: R11.2

## 2016-09-14 HISTORY — DX: Adverse effect of unspecified anesthetic, initial encounter: T41.45XA

## 2016-09-14 HISTORY — DX: Headache: R51

## 2016-09-14 HISTORY — DX: Other specified postprocedural states: Z98.890

## 2016-09-14 HISTORY — PX: RADIOACTIVE SEED GUIDED PARTIAL MASTECTOMY WITH AXILLARY SENTINEL LYMPH NODE BIOPSY: SHX6520

## 2016-09-14 SURGERY — RADIOACTIVE SEED GUIDED PARTIAL MASTECTOMY WITH AXILLARY SENTINEL LYMPH NODE BIOPSY
Anesthesia: General | Site: Breast | Laterality: Right

## 2016-09-14 MED ORDER — LACTATED RINGERS IV SOLN
INTRAVENOUS | Status: DC
  Administered 2016-09-14 (×2): via INTRAVENOUS

## 2016-09-14 MED ORDER — LIDOCAINE 2% (20 MG/ML) 5 ML SYRINGE
INTRAMUSCULAR | Status: AC
  Filled 2016-09-14: qty 5

## 2016-09-14 MED ORDER — CHLORHEXIDINE GLUCONATE CLOTH 2 % EX PADS: 6.0000 | MEDICATED_PAD | Freq: Once | CUTANEOUS | Status: DC

## 2016-09-14 MED ORDER — ACETAMINOPHEN 500 MG PO TABS
1000.0000 mg | ORAL_TABLET | ORAL | Status: AC
  Administered 2016-09-14: 1000 mg via ORAL

## 2016-09-14 MED ORDER — FENTANYL CITRATE (PF) 100 MCG/2ML IJ SOLN
50.0000 ug | INTRAMUSCULAR | Status: DC | PRN
  Administered 2016-09-14: 50 ug via INTRAVENOUS

## 2016-09-14 MED ORDER — BUPIVACAINE-EPINEPHRINE (PF) 0.5% -1:200000 IJ SOLN
INTRAMUSCULAR | Status: DC | PRN
  Administered 2016-09-14: 25 mL

## 2016-09-14 MED ORDER — TECHNETIUM TC 99M SULFUR COLLOID FILTERED
1.0000 | Freq: Once | INTRAVENOUS | Status: AC | PRN
  Administered 2016-09-14: 1 via INTRADERMAL

## 2016-09-14 MED ORDER — DEXAMETHASONE SODIUM PHOSPHATE 4 MG/ML IJ SOLN
INTRAMUSCULAR | Status: DC | PRN
  Administered 2016-09-14: 10 mg via INTRAVENOUS

## 2016-09-14 MED ORDER — HYDROMORPHONE HCL 1 MG/ML IJ SOLN
INTRAMUSCULAR | Status: AC
  Filled 2016-09-14: qty 1

## 2016-09-14 MED ORDER — LIDOCAINE 2% (20 MG/ML) 5 ML SYRINGE
INTRAMUSCULAR | Status: DC | PRN
  Administered 2016-09-14: 20 mg via INTRAVENOUS

## 2016-09-14 MED ORDER — PROMETHAZINE HCL 25 MG/ML IJ SOLN
6.2500 mg | INTRAMUSCULAR | Status: DC | PRN
  Administered 2016-09-14: 6.25 mg via INTRAVENOUS

## 2016-09-14 MED ORDER — CELECOXIB 400 MG PO CAPS
400.0000 mg | ORAL_CAPSULE | ORAL | Status: AC
Start: 2016-09-14 — End: 2016-09-14
  Administered 2016-09-14: 400 mg via ORAL

## 2016-09-14 MED ORDER — CELECOXIB 200 MG PO CAPS
ORAL_CAPSULE | ORAL | Status: AC
  Filled 2016-09-14: qty 2

## 2016-09-14 MED ORDER — IBUPROFEN 800 MG PO TABS: 800.0000 mg | ORAL_TABLET | Freq: Three times a day (TID) | ORAL | 0 refills | Status: DC | PRN

## 2016-09-14 MED ORDER — SCOPOLAMINE 1 MG/3DAYS TD PT72: 1.0000 | MEDICATED_PATCH | Freq: Once | TRANSDERMAL | Status: DC | PRN

## 2016-09-14 MED ORDER — BUPIVACAINE-EPINEPHRINE (PF) 0.25% -1:200000 IJ SOLN
INTRAMUSCULAR | Status: DC | PRN
  Administered 2016-09-14: 10 mL

## 2016-09-14 MED ORDER — FENTANYL CITRATE (PF) 100 MCG/2ML IJ SOLN
INTRAMUSCULAR | Status: AC
  Filled 2016-09-14: qty 2

## 2016-09-14 MED ORDER — DEXTROSE 5 % IV SOLN
3.0000 g | INTRAVENOUS | Status: AC
  Administered 2016-09-14: 2 g via INTRAVENOUS

## 2016-09-14 MED ORDER — PROMETHAZINE HCL 25 MG/ML IJ SOLN
INTRAMUSCULAR | Status: AC
  Filled 2016-09-14: qty 1

## 2016-09-14 MED ORDER — PROPOFOL 10 MG/ML IV BOLUS
INTRAVENOUS | Status: AC
  Filled 2016-09-14: qty 20

## 2016-09-14 MED ORDER — METHYLENE BLUE 0.5 % INJ SOLN
INTRAVENOUS | Status: AC
  Filled 2016-09-14: qty 10

## 2016-09-14 MED ORDER — MIDAZOLAM HCL 2 MG/2ML IJ SOLN
1.0000 mg | INTRAMUSCULAR | Status: DC | PRN
  Administered 2016-09-14: 1 mg via INTRAVENOUS

## 2016-09-14 MED ORDER — PROPOFOL 10 MG/ML IV BOLUS
INTRAVENOUS | Status: DC | PRN
  Administered 2016-09-14: 130 mg via INTRAVENOUS

## 2016-09-14 MED ORDER — MIDAZOLAM HCL 2 MG/2ML IJ SOLN
INTRAMUSCULAR | Status: AC
  Filled 2016-09-14: qty 2

## 2016-09-14 MED ORDER — ACETAMINOPHEN 500 MG PO TABS
ORAL_TABLET | ORAL | Status: AC
  Filled 2016-09-14: qty 2

## 2016-09-14 MED ORDER — DEXAMETHASONE SODIUM PHOSPHATE 10 MG/ML IJ SOLN
INTRAMUSCULAR | Status: AC
  Filled 2016-09-14: qty 1

## 2016-09-14 MED ORDER — HYDROMORPHONE HCL 1 MG/ML IJ SOLN
0.2500 mg | INTRAMUSCULAR | Status: DC | PRN
  Administered 2016-09-14 (×2): 0.5 mg via INTRAVENOUS

## 2016-09-14 MED ORDER — CEFAZOLIN SODIUM-DEXTROSE 2-4 GM/100ML-% IV SOLN
INTRAVENOUS | Status: AC
  Filled 2016-09-14: qty 100

## 2016-09-14 MED ORDER — GABAPENTIN 300 MG PO CAPS
300.0000 mg | ORAL_CAPSULE | ORAL | Status: AC
  Administered 2016-09-14: 300 mg via ORAL

## 2016-09-14 MED ORDER — SODIUM CHLORIDE 0.9 % IJ SOLN
INTRAVENOUS | Status: DC | PRN
  Administered 2016-09-14: 5 mL

## 2016-09-14 MED ORDER — ONDANSETRON HCL 4 MG/2ML IJ SOLN
INTRAMUSCULAR | Status: AC
  Filled 2016-09-14: qty 2

## 2016-09-14 MED ORDER — HYDROCODONE-ACETAMINOPHEN 5-325 MG PO TABS: 1.0000 | ORAL_TABLET | Freq: Four times a day (QID) | ORAL | 0 refills | Status: DC | PRN

## 2016-09-14 MED ORDER — EPHEDRINE SULFATE 50 MG/ML IJ SOLN
INTRAMUSCULAR | Status: DC | PRN
  Administered 2016-09-14: 10 mg via INTRAVENOUS

## 2016-09-14 MED ORDER — GABAPENTIN 300 MG PO CAPS
ORAL_CAPSULE | ORAL | Status: AC
  Filled 2016-09-14: qty 1

## 2016-09-14 SURGICAL SUPPLY — 50 items
APPLIER CLIP 9.375 MED OPEN (MISCELLANEOUS) ×3
BINDER BREAST LRG (GAUZE/BANDAGES/DRESSINGS) IMPLANT
BINDER BREAST MEDIUM (GAUZE/BANDAGES/DRESSINGS) ×3 IMPLANT
BINDER BREAST XLRG (GAUZE/BANDAGES/DRESSINGS) IMPLANT
BINDER BREAST XXLRG (GAUZE/BANDAGES/DRESSINGS) IMPLANT
BLADE SURG 15 STRL LF DISP TIS (BLADE) ×1 IMPLANT
BLADE SURG 15 STRL SS (BLADE) ×2
CANISTER SUC SOCK COL 7IN (MISCELLANEOUS) IMPLANT
CANISTER SUCT 1200ML W/VALVE (MISCELLANEOUS) ×3 IMPLANT
CHLORAPREP W/TINT 26ML (MISCELLANEOUS) ×3 IMPLANT
CLIP APPLIE 9.375 MED OPEN (MISCELLANEOUS) ×1 IMPLANT
COVER BACK TABLE 60X90IN (DRAPES) ×3 IMPLANT
COVER MAYO STAND STRL (DRAPES) ×3 IMPLANT
COVER PROBE W GEL 5X96 (DRAPES) ×3 IMPLANT
DECANTER SPIKE VIAL GLASS SM (MISCELLANEOUS) IMPLANT
DERMABOND ADVANCED (GAUZE/BANDAGES/DRESSINGS) ×2
DERMABOND ADVANCED .7 DNX12 (GAUZE/BANDAGES/DRESSINGS) ×1 IMPLANT
DEVICE DUBIN W/COMP PLATE 8390 (MISCELLANEOUS) ×3 IMPLANT
DRAPE LAPAROSCOPIC ABDOMINAL (DRAPES) ×3 IMPLANT
DRAPE UTILITY XL STRL (DRAPES) ×3 IMPLANT
ELECT COATED BLADE 2.86 ST (ELECTRODE) ×3 IMPLANT
ELECT REM PT RETURN 9FT ADLT (ELECTROSURGICAL) ×3
ELECTRODE REM PT RTRN 9FT ADLT (ELECTROSURGICAL) ×1 IMPLANT
GLOVE BIOGEL PI IND STRL 7.0 (GLOVE) ×3 IMPLANT
GLOVE BIOGEL PI IND STRL 8 (GLOVE) ×1 IMPLANT
GLOVE BIOGEL PI INDICATOR 7.0 (GLOVE) ×6
GLOVE BIOGEL PI INDICATOR 8 (GLOVE) ×2
GLOVE ECLIPSE 8.0 STRL XLNG CF (GLOVE) IMPLANT
GLOVE SURG SS PI 6.5 STRL IVOR (GLOVE) ×6 IMPLANT
GLOVE SURG SS PI 8.0 STRL IVOR (GLOVE) ×6 IMPLANT
GOWN STRL REUS W/ TWL LRG LVL3 (GOWN DISPOSABLE) ×3 IMPLANT
GOWN STRL REUS W/TWL LRG LVL3 (GOWN DISPOSABLE) ×6
HEMOSTAT SNOW SURGICEL 2X4 (HEMOSTASIS) ×6 IMPLANT
KIT MARKER MARGIN INK (KITS) ×3 IMPLANT
NDL SAFETY ECLIPSE 18X1.5 (NEEDLE) ×1 IMPLANT
NEEDLE HYPO 18GX1.5 SHARP (NEEDLE) ×2
NEEDLE HYPO 25X1 1.5 SAFETY (NEEDLE) ×6 IMPLANT
NS IRRIG 1000ML POUR BTL (IV SOLUTION) ×3 IMPLANT
PACK BASIN DAY SURGERY FS (CUSTOM PROCEDURE TRAY) ×3 IMPLANT
PENCIL BUTTON HOLSTER BLD 10FT (ELECTRODE) ×3 IMPLANT
SLEEVE SCD COMPRESS KNEE MED (MISCELLANEOUS) ×3 IMPLANT
SPONGE LAP 4X18 X RAY DECT (DISPOSABLE) ×3 IMPLANT
SUT MNCRL AB 4-0 PS2 18 (SUTURE) ×6 IMPLANT
SUT VICRYL 3-0 CR8 SH (SUTURE) ×3 IMPLANT
SYR CONTROL 10ML LL (SYRINGE) ×6 IMPLANT
TOWEL OR 17X24 6PK STRL BLUE (TOWEL DISPOSABLE) ×3 IMPLANT
TOWEL OR NON WOVEN STRL DISP B (DISPOSABLE) ×3 IMPLANT
TUBE CONNECTING 20'X1/4 (TUBING) ×1
TUBE CONNECTING 20X1/4 (TUBING) ×2 IMPLANT
YANKAUER SUCT BULB TIP NO VENT (SUCTIONS) ×3 IMPLANT

## 2016-09-14 NOTE — Discharge Instructions (Signed)
[facility] °Office Phone Number [phone] ° °BREAST BIOPSY/ PARTIAL MASTECTOMY: POST OP INSTRUCTIONS ° °Always review your discharge instruction sheet given to you by the facility where your surgery was performed. ° °IF YOU HAVE DISABILITY OR FAMILY LEAVE FORMS, YOU MUST BRING THEM TO THE OFFICE FOR PROCESSING.  DO NOT GIVE THEM TO YOUR DOCTOR. ° °1. A prescription for pain medication may be given to you upon discharge.  Take your pain medication as prescribed, if needed.  If narcotic pain medicine is not needed, then you may take acetaminophen (Tylenol) or ibuprofen (Advil) as needed. °2. Take your usually prescribed medications unless otherwise directed °3. If you need a refill on your pain medication, please contact your pharmacy.  They will contact our office to request authorization.  Prescriptions will not be filled after 5pm or on week-ends. °4. You should eat very light the first 24 hours after surgery, such as soup, crackers, pudding, etc.  Resume your normal diet the day after surgery. °5. Most patients will experience some swelling and bruising in the breast.  Ice packs and a good support bra will help.  Swelling and bruising can take several days to resolve.  °6. It is common to experience some constipation if taking pain medication after surgery.  Increasing fluid intake and taking a stool softener will usually help or prevent this problem from occurring.  A mild laxative (Milk of Magnesia or Miralax) should be taken according to package directions if there are no bowel movements after 48 hours. °7. Unless discharge instructions indicate otherwise, you may remove your bandages 24-48 hours after surgery, and you may shower at that time.  You may have steri-strips (small skin tapes) in place directly over the incision.  These strips should be left on the skin for 7-10 days.  If your surgeon used skin glue on the incision, you may shower in 24 hours.  The glue will flake off over the  next 2-3 weeks.  Any sutures or staples will be removed at the office during your follow-up visit. °8. ACTIVITIES:  You may resume regular daily activities (gradually increasing) beginning the next day.  Wearing a good support bra or sports bra minimizes pain and swelling.  You may have sexual intercourse when it is comfortable. °a. You may drive when you no longer are taking prescription pain medication, you can comfortably wear a seatbelt, and you can safely maneuver your car and apply brakes. °b. RETURN TO WORK:  ______________________________________________________________________________________ °9. You should see your doctor in the office for a follow-up appointment approximately two weeks after your surgery.  Your doctor’s nurse will typically make your follow-up appointment when she calls you with your pathology report.  Expect your pathology report 2-3 business days after your surgery.  You may call to check if you do not hear from us after three days. °10. OTHER INSTRUCTIONS: _______________________________________________________________________________________________ _____________________________________________________________________________________________________________________________________ °_____________________________________________________________________________________________________________________________________ °_____________________________________________________________________________________________________________________________________ ° °WHEN TO CALL YOUR DOCTOR: °1. Fever over 101.0 °2. Nausea and/or vomiting. °3. Extreme swelling or bruising. °4. Continued bleeding from incision. °5. Increased pain, redness, or drainage from the incision. ° °The clinic staff is available to answer your questions during regular business hours.  Please don’t hesitate to call and ask to speak to one of the nurses for clinical concerns.  If you have a medical emergency, go to the nearest  emergency room or call 911.  A surgeon from [facility] is always on call at the hospital. ° °For further questions, please visit [email or web address]  ° ° ° ° °  Post Anesthesia Home Care Instructions ° °Activity: °Get plenty of rest for the remainder of the day. A responsible individual must stay with you for 24 hours following the procedure.  °For the next 24 hours, DO NOT: °-Drive a car °-Operate machinery °-Drink alcoholic beverages °-Take any medication unless instructed by your physician °-Make any legal decisions or sign important papers. ° °Meals: °Start with liquid foods such as gelatin or soup. Progress to regular foods as tolerated. Avoid greasy, spicy, heavy foods. If nausea and/or vomiting occur, drink only clear liquids until the nausea and/or vomiting subsides. Call your physician if vomiting continues. ° °Special Instructions/Symptoms: °Your throat may feel dry or sore from the anesthesia or the breathing tube placed in your throat during surgery. If this causes discomfort, gargle with warm salt water. The discomfort should disappear within 24 hours. ° °If you had a scopolamine patch placed behind your ear for the management of post- operative nausea and/or vomiting: ° °1. The medication in the patch is effective for 72 hours, after which it should be removed.  Wrap patch in a tissue and discard in the trash. Wash hands thoroughly with soap and water. °2. You may remove the patch earlier than 72 hours if you experience unpleasant side effects which may include dry mouth, dizziness or visual disturbances. °3. Avoid touching the patch. Wash your hands with soap and water after contact with the patch. °  ° °

## 2016-09-14 NOTE — Interval H&P Note (Signed)
History and Physical Interval Note:  09/14/2016 11:37 AM  Crystal Tate  has presented today for surgery, with the diagnosis of RIGHT BREAST CANCER  The various methods of treatment have been discussed with the patient and family. After consideration of risks, benefits and other options for treatment, the patient has consented to  Procedure(s): RADIOACTIVE SEED GUIDED PARTIAL MASTECTOMY WITH AXILLARY SENTINEL LYMPH NODE BIOPSY (Right) as a surgical intervention .  The patient's history has been reviewed, patient examined, no change in status, stable for surgery.  I have reviewed the patient's chart and labs.  Questions were answered to the patient's satisfaction.     [name]

## 2016-09-14 NOTE — Anesthesia Procedure Notes (Addendum)
Anesthesia Regional Block: Pectoralis block   Pre-Anesthetic Checklist: ,, timeout performed, Correct Patient, Correct Site, Correct Laterality, Correct Procedure, Correct Position, site marked, Risks and benefits discussed,  Surgical consent,  Pre-op evaluation,  At surgeon's request and post-op pain management  Laterality: Right  Prep: chloraprep       Needles:  Injection technique: Single-shot  Needle Type: Echogenic Needle     Needle Length: 9cm  Needle Gauge: 21     Additional Needles:   Procedures: ultrasound guided,,,,,,,,  Narrative:  Start time: 09/14/2016 10:52 AM End time: 09/14/2016 10:59 AM Injection made incrementally with aspirations every 5 mL.  Performed by: Personally  Anesthesiologist: Suzette Battiest

## 2016-09-14 NOTE — Anesthesia Procedure Notes (Signed)
Procedure Name: LMA Insertion Date/Time: 09/14/2016 11:57 AM Performed by: Genelle Bal Pre-anesthesia Checklist: Patient identified, Emergency Drugs available, Suction available and Patient being monitored Patient Re-evaluated:Patient Re-evaluated prior to inductionOxygen Delivery Method: Circle system utilized Preoxygenation: Pre-oxygenation with 100% oxygen Intubation Type: IV induction Ventilation: Mask ventilation without difficulty LMA: LMA inserted LMA Size: 4.0 Number of attempts: 1 Airway Equipment and Method: Bite block Placement Confirmation: positive ETCO2 and breath sounds checked- equal and bilateral Tube secured with: Tape Dental Injury: Teeth and Oropharynx as per pre-operative assessment

## 2016-09-14 NOTE — Progress Notes (Signed)
Assisted Dr. Rob Fitzgerald with right, ultrasound guided, axillary block. Side rails up, monitors on throughout procedure. See vital signs in flow sheet. Tolerated Procedure well. 

## 2016-09-14 NOTE — Transfer of Care (Signed)
Immediate Anesthesia Transfer of Care Note  Patient: Crystal Tate  Procedure(s) Performed: Procedure(s): RADIOACTIVE SEED GUIDED PARTIAL MASTECTOMY WITH AXILLARY SENTINEL LYMPH NODE BIOPSY (Right)  Patient Location: PACU  Anesthesia Type:General and GA combined with regional for post-op pain  Level of Consciousness: sedated  Airway & Oxygen Therapy: Patient Spontanous Breathing and Patient connected to face mask oxygen  Post-op Assessment: Report given to RN and Post -op Vital signs reviewed and stable  Post vital signs: Reviewed and stable  Last Vitals:  Vitals:   09/14/16 1109 09/14/16 1116  BP: 110/61 (!) 106/55  Pulse: 69 (!) 59  Resp: 19 15  Temp:      Last Pain:  Vitals:   09/14/16 1002  TempSrc: Oral  PainSc: 0-No pain         Complications: No apparent anesthesia complications

## 2016-09-14 NOTE — H&P (View-Only) (Signed)
Crystal Tate 08/22/2016 3:24 PM Location: [facility] Patient #: [id number] DOB: 09/10/1940 Widowed / Language: English / Race: White Female   History of Present Illness (Crystal Tate A. Yamileth Hayse MD; 08/22/2016 5:26 PM) Patient words: Patient sent at the request of Dr. Steven Reed for mammographic abnormality. The patient went for screening mammogram and a density was found in the right breast. This measured 1.7 cm in maximal diameter in her axilla was normal. Core biopsy was done. 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 Patient denies any history of breast pain, breast mass or nipple discharge.                   Patient recalled from screening for right breast mass.  EXAM: 2D DIGITAL DIAGNOSTIC RIGHT MAMMOGRAM WITH CAD AND ADJUNCT TOMO  ULTRASOUND RIGHT BREAST  COMPARISON: Previous exam(s).  ACR Breast Density Category b: There are scattered areas of fibroglandular density.  FINDINGS: Irregular mass within the upper-outer right breast. Biopsy marking clip within the lateral right breast from prior remote biopsy. No additional concerning masses or areas of calcification within the right breast.  Mammographic images were processed with CAD.  On physical exam, I palpate a small mass within the upper-outer right breast.  Targeted ultrasound is performed, showing a 1.7 x 1.2 x 1.4 cm irregular mixed echogenicity predominately hypoechoic mass right breast 11 o'clock position 5 cm from the nipple, corresponding with mammographic abnormality. No right axillary adenopathy.  IMPRESSION: Suspicious right breast mass 11 o'clock position.  RECOMMENDATION: Ultrasound-guided core needle biopsy suspicious right breast mass 11 o'clock position.  I have discussed the findings and recommendations with the patient. Results were also provided in writing at the conclusion of the visit. If applicable, a reminder letter will be  sent to the patient regarding the next appointment.  BI-RADS CATEGORY 4: Suspicious.   Electronically Signed By: [name]              ADDITIONAL INFORMATION: PROGNOSTIC INDICATORS Results: IMMUNOHISTOCHEMICAL AND MORPHOMETRIC ANALYSIS PERFORMED MANUALLY Estrogen Receptor: 100%, POSITIVE, STRONG STAINING INTENSITY Progesterone Receptor: 100%, POSITIVE, STRONG STAINING INTENSITY Proliferation Marker Ki67: 10% REFERENCE RANGE ESTROGEN RECEPTOR NEGATIVE 0% POSITIVE =>1% REFERENCE RANGE PROGESTERONE RECEPTOR NEGATIVE 0% POSITIVE =>1% All controls stained appropriately Crystal MANNY MD Pathologist, Electronic Signature ( Signed 08/15/2016) FLUORESCENCE IN-SITU HYBRIDIZATION Results: HER2 - NEGATIVE RATIO OF HER2/CEP17 SIGNALS 1.27 AVERAGE HER2 COPY NUMBER PER CELL 2.55 Reference Range: NEGATIVE HER2/CEP17 Ratio <2.0 and average HER2 copy number <4.0 EQUIVOCAL HER2/CEP17 Ratio <2.0 and average HER2 copy number >=4.0 and <6.0 1 of 3 FINAL for Drew, Crystal Tate ([id number]) ADDITIONAL INFORMATION:(continued) POSITIVE HER2/CEP17 Ratio >=2.0 or <2.0 and average HER2 copy number >=6.0 [name] Pathologist, Electronic Signature ( Signed 08/16/2016) FINAL DIAGNOSIS Diagnosis Breast, right, needle core biopsy, 11 o'clock - INVASIVE DUCTAL CARCINOMA. - SEE COMMENT. Microscopic Comment The carcinoma appears grade 1-2.  The patient is a 75 year old female.   Past Surgical History (Crystal Tate, CMA; 08/22/2016 3:24 PM) Breast Biopsy  Bilateral. Tonsillectomy   Diagnostic Studies History (Crystal Tate, CMA; 08/22/2016 3:24 PM) Mammogram  within last year Pap Smear  1-5 years ago  Allergies (Crystal Tate, CMA; 08/22/2016 3:25 PM) No Known Drug Allergies 08/22/2016 Allergies Reconciled   Medication History (Crystal Tate, CMA; 08/22/2016 3:28 PM) ValACYclovir HCl (500MG Tablet, Oral) Active. Aspirin (81MG Capsule, Oral) Active. Biotin (10MG  Capsule, Oral) Active. Calcium Citrate + D (250-200MG-UNIT Tablet, Oral) Active. Cyanocobalamin (100MCG Lozenge, Oral) Active. Fish   Oil + D3 (1000-1000MG-UNIT Capsule, Oral) Active. Flaxseed (Linseed) (1000MG Capsule, Oral) Active. Omeprazole (20MG Capsule DR, Oral) Active. Medications Reconciled  Social History (Crystal Tate, CMA; 08/22/2016 3:24 PM) Alcohol use  Moderate alcohol use. Caffeine use  Carbonated beverages, Coffee, Tea. No drug use  Tobacco use  Never smoker.  Family History (Crystal Tate, CMA; 08/22/2016 3:24 PM) Cancer  Father. Melanoma  Son.  Pregnancy / Birth History (Crystal Tate, CMA; 08/22/2016 3:24 PM) Age at menarche  89 years. Age of menopause  70-50 Contraceptive History  Intrauterine device, Oral contraceptives. Gravida  2 Maternal age  71-25 Para  2  Other Problems (Crystal Tate, CMA; 08/22/2016 3:24 PM) Breast Cancer  Gastroesophageal Reflux Disease  Lump In Breast     Review of Systems (Crystal Tate CMA; 08/22/2016 3:24 PM) General Not Present- Appetite Loss, Chills, Fatigue, Fever, Night Sweats, Weight Gain and Weight Loss. Skin Not Present- Change in Wart/Mole, Dryness, Hives, Jaundice, New Lesions, Non-Healing Wounds, Rash and Ulcer. HEENT Present- Ringing in the Ears. Not Present- Earache, Hearing Loss, Hoarseness, Nose Bleed, Oral Ulcers, Seasonal Allergies, Sinus Pain, Sore Throat, Visual Disturbances, Wears glasses/contact lenses and Yellow Eyes. Breast Present- Breast Mass. Not Present- Breast Pain, Nipple Discharge and Skin Changes. Cardiovascular Not Present- Chest Pain, Difficulty Breathing Lying Down, Leg Cramps, Palpitations, Rapid Heart Rate, Shortness of Breath and Swelling of Extremities. Gastrointestinal Not Present- Abdominal Pain, Bloating, Bloody Stool, Change in Bowel Habits, Chronic diarrhea, Constipation, Difficulty Swallowing, Excessive gas, Gets full quickly at meals, Hemorrhoids, Indigestion, Nausea,  Rectal Pain and Vomiting. Female Genitourinary Present- Urgency. Not Present- Frequency, Nocturia, Painful Urination and Pelvic Pain. Musculoskeletal Not Present- Back Pain, Joint Pain, Joint Stiffness, Muscle Pain, Muscle Weakness and Swelling of Extremities. Neurological Present- Decreased Memory. Not Present- Fainting, Headaches, Numbness, Seizures, Tingling, Tremor, Trouble walking and Weakness. Endocrine Present- Hot flashes. Not Present- Cold Intolerance, Excessive Hunger, Hair Changes, Heat Intolerance and New Diabetes. Hematology Not Present- Blood Thinners, Easy Bruising, Excessive bleeding, Gland problems, HIV and Persistent Infections.  Vitals (Crystal Tate CMA; 08/22/2016 3:29 PM) 08/22/2016 3:28 PM Weight: 111.8 lb Height: 62in Body Surface Area: 1.49 m Body Mass Index: 20.45 kg/m  Temp.: 97.70F  Pulse: 70 (Regular)  BP: 110/60 (Sitting, Left Arm, Standard)       Physical Exam (Dia Donate A. Shavana Calder MD; 08/22/2016 5:24 PM) General Mental Status-Alert. General Appearance-Consistent with stated age. Hydration-Well hydrated. Voice-Normal.  Head and Neck Head-normocephalic, atraumatic with no lesions or palpable masses. Trachea-midline. Thyroid Gland Characteristics - normal size and consistency.  Eye Eyeball - Bilateral-Extraocular movements intact. Sclera/Conjunctiva - Bilateral-No scleral icterus.  Breast Breast - Left-Symmetric, Non Tender, No Biopsy scars, no Dimpling, No Inflammation, No Lumpectomy scars, No Mastectomy scars, No Peau d' Orange. Breast - Right-Symmetric, Non Tender, No Biopsy scars, no Dimpling, No Inflammation, No Lumpectomy scars, No Mastectomy scars, No Peau d' Orange. Breast Lump-No Palpable Breast Mass. Note: BRUISING RIGHT BREAST NOTED   Cardiovascular Cardiovascular examination reveals -normal heart sounds, regular rate and rhythm with no murmurs and normal pedal pulses  bilaterally.  Neurologic Neurologic evaluation reveals -alert and oriented x 3 with no impairment of recent or remote memory. Mental Status-Normal.  Musculoskeletal Normal Exam - Left-Upper Extremity Strength Normal and Lower Extremity Strength Normal. Normal Exam - Right-Upper Extremity Strength Normal and Lower Extremity Strength Normal.  Lymphatic Head & Neck  General Head & Neck Lymphatics: Bilateral - Description - Normal. Axillary  General Axillary Region: Bilateral - Description - Normal. Tenderness - Non Tender.  Assessment & Plan (Meshawn Oconnor A. Dellie Piasecki MD; 08/22/2016 5:26 PM) BREAST CANCER, RIGHT (C50.911) Impression: Discussed options of breast conservation versus mastectomy with reconstruction. The patient is opted for right breast partial mastectomy with sentinel lymph node mapping. Risk of lumpectomy include bleeding, infection, seroma, more surgery, use of seed/wire, wound care, cosmetic deformity and the need for other treatments, death , blood clots, death. Pt agrees to proceed. Risk of sentinel lymph node mapping include bleeding, infection, lymphedema, shoulder pain. stiffness, dye allergy. cosmetic deformity , blood clots, death, need for more surgery. Pt agres to proceed. Current Plans Referred to Oncology, for evaluation and follow up (Oncology). Routine. Referred to Radiation Oncology, for evaluation and follow up (Radiation Oncology). Routine. Referred to Physical Therapy, for evaluation and follow up (Physical Therapy). Routine. You are being scheduled for surgery- Our schedulers will call you.  You should hear from our office's scheduling department within 5 working days about the location, date, and time of surgery. We try to make accommodations for patient's preferences in scheduling surgery, but sometimes the OR schedule or the surgeon's schedule prevents [place] from making those accommodations.  If you have not heard from our office [phone]) in  5 working days, call the office and ask for your surgeon's nurse.  If you have other questions about your diagnosis, plan, or surgery, call the office and ask for your surgeon's nurse.  Pt Education - [facility] Breast Cancer Information Given - [employer] "Breast Journey" Package We discussed the staging and pathophysiology of breast cancer. We discussed all of the different options for treatment for breast cancer including surgery, chemotherapy, radiation therapy, Herceptin, and antiestrogen therapy. We discussed a sentinel lymph node biopsy as she does not appear to having lymph node involvement right now. We discussed the performance of that with injection of radioactive tracer and blue dye. We discussed that she would have an incision underneath her axillary hairline. We discussed that there is a bout a 10-20% chance of having a positive node with a sentinel lymph node biopsy and we will await the permanent pathology to make any other first further decisions in terms of her treatment. One of these options might be to return to the operating room to perform an axillary lymph node dissection. We discussed about a 1-2% risk lifetime of chronic shoulder pain as well as lymphedema associated with a sentinel lymph node biopsy. We discussed the options for treatment of the breast cancer which included lumpectomy versus a mastectomy. We discussed the performance of the lumpectomy with a wire placement. We discussed a 10-20% chance of a positive margin requiring reexcision in the operating room. We also discussed that she may need radiation therapy or antiestrogen therapy or both if she undergoes lumpectomy. We discussed the mastectomy and the postoperative care for that as well. We discussed that there is no difference in her survival whether she undergoes lumpectomy with radiation therapy or antiestrogen therapy versus a mastectomy. There is a slight difference in the local recurrence rate being 3-5% with lumpectomy and  about 1% with a mastectomy. We discussed the risks of operation including bleeding, infection, possible reoperation. She understands her further therapy will be based on what her stages at the time of her operation.  Pt Education - [facility] breast cancer surgery: discussed with patient and provided information. Pt Education - [facility] Breast Biopsy HCI: discussed with patient and provided information. Pt Education - ABC (After Breast Cancer) Class Info: discussed with patient and provided information.   Signed by Joyice Faster  Noora Locascio, MD (08/22/2016 5:31 PM) 

## 2016-09-14 NOTE — Anesthesia Postprocedure Evaluation (Signed)
Anesthesia Post Note  Patient: Crystal Tate  Procedure(s) Performed: Procedure(s) (LRB): RADIOACTIVE SEED GUIDED PARTIAL MASTECTOMY WITH AXILLARY SENTINEL LYMPH NODE BIOPSY (Right)  Patient location during evaluation: PACU Anesthesia Type: General Level of consciousness: awake and alert Pain management: pain level controlled Vital Signs Assessment: post-procedure vital signs reviewed and stable Respiratory status: spontaneous breathing, nonlabored ventilation, respiratory function stable and patient connected to nasal cannula oxygen Cardiovascular status: blood pressure returned to baseline and stable Postop Assessment: no signs of nausea or vomiting Anesthetic complications: no       Last Vitals:  Vitals:   09/14/16 1415 09/14/16 1433  BP: 123/68 122/61  Pulse: 70   Resp: 20 16  Temp:  36.3 C    Last Pain:  Vitals:   09/14/16 1447  TempSrc:   PainSc: 3                  Tiajuana Amass

## 2016-09-14 NOTE — Anesthesia Preprocedure Evaluation (Signed)
Anesthesia Evaluation  Patient identified by MRN, date of birth, ID band Patient awake    Reviewed: Allergy & Precautions, NPO status , Patient's Chart, lab work & pertinent test results  History of Anesthesia Complications (+) PONV  Airway Mallampati: II  TM Distance: >3 FB Neck ROM: Full    Dental  (+) Dental Advisory Given   Pulmonary neg pulmonary ROS,    breath sounds clear to auscultation       Cardiovascular negative cardio ROS   Rhythm:Regular Rate:Normal     Neuro/Psych negative neurological ROS  negative psych ROS   GI/Hepatic Neg liver ROS, GERD  ,  Endo/Other  negative endocrine ROS  Renal/GU negative Renal ROS  negative genitourinary   Musculoskeletal negative musculoskeletal ROS (+)   Abdominal   Peds negative pediatric ROS (+)  Hematology negative hematology ROS (+)   Anesthesia Other Findings   Reproductive/Obstetrics negative OB ROS                             Anesthesia Physical Anesthesia Plan  ASA: II  Anesthesia Plan: General   Post-op Pain Management:  Regional for Post-op pain   Induction: Intravenous  Airway Management Planned: LMA  Additional Equipment:   Intra-op Plan:   Post-operative Plan: Extubation in OR  Informed Consent: I have reviewed the patients History and Physical, chart, labs and discussed the procedure including the risks, benefits and alternatives for the proposed anesthesia with the patient or authorized representative who has indicated his/her understanding and acceptance.   Dental advisory given  Plan Discussed with:   Anesthesia Plan Comments:         Anesthesia Quick Evaluation

## 2016-09-14 NOTE — Op Note (Signed)
Preoperative diagnosis: Stage I right breast cancer  Postoperative diagnosis: Same  Procedure: Right breast seed localized partial mastectomy with right axillary sentinel lymph node mapping with methylene blue dye deep  Surgeon Erroll Luna M.D.  Anesthesia: Gen. with pectoral block and local  EBL: Minimal  Specimen: Right breast mass with clip and seed in specimen with grossly negative margins and one right axillary sentinel node hot and blue additional superior and anterior margin  Drains: None  Indications for procedure: The patient presents for right breast partial mastectomy for stage I breast cancer. Risks, benefits and alternatives to treatment and surgery were discussed.The procedure has been discussed with the patient. Alternatives to surgery have been discussed with the patient.  Risks of surgery include bleeding,  Infection,  Seroma formation, death,  and the need for further surgery.   The patient understands and wishes to proceed.  Sentinel lymph node mapping and dissection has been discussed with the patient.  Risk of bleeding,  Infection,  Seroma formation,  Additional procedures,,  Shoulder weakness ,  Shoulder stiffness,  Nerve and blood vessel injury and reaction to the mapping dyes have been discussed.  Alternatives to surgery have been discussed with the patient.  The patient agrees to proceed.   Description of procedure: The patient was met in holding area and questions were answered. Her right breast was marked as the correct side. She was taken back to the operating room placed upon the OR table. After induction of general anesthesia, right breast was prepped draped sterile fashion. X-rays were available reviewed  for visualization of the seed. 4 mL of methylene blue dye were injected under the right nipple. This was massaged. Neoprobe was used and seed localized in the right upper quadrant of the breast. Curvilinear incision was made over this. Dissection was carried  down to the mass and all tissue around the mass was excised. I took additional superior and anterior margins margins appeared grossly clear. Through same incision I used the neoprobe to identify the sentinel node. A blue hot level I deep saddlenose identified and excised. Background counts were less than 10% threshold of the injection site. Irrigation was used. Hemostasis achieved with cautery and Surgicel snow. Wound closed with 3-0 Vicryl and 4-0 Monocryl. The breast tissues mobilized prior to this to facilitate closure. Dermabond was applied. All final counts are found to be correct. Breast binder placed. All final counts are found to be correct. Patient was awoke extubated taken recovery in satisfactory condition.

## 2016-09-15 ENCOUNTER — Encounter (HOSPITAL_BASED_OUTPATIENT_CLINIC_OR_DEPARTMENT_OTHER): Payer: Self-pay | Admitting: Surgery

## 2016-09-21 ENCOUNTER — Ambulatory Visit: Payer: Medicare Other | Admitting: Hematology and Oncology

## 2016-09-24 NOTE — Assessment & Plan Note (Deleted)
09/18/16: Rt Lumpectomy: IDC grade 2, 1.7 cm, 0/1 LN Neg, Er 100%, PR 100%, Her 2 Neg, T1CN0 Stage 1A  Pathology counseling: I discussed the final pathology report of the patient provided  a copy of this report. I discussed the margins as well as lymph node surgeries. We also discussed the final staging along with previously performed ER/PR and HER-2/neu testing.  Treatment Plan: 1. Oncotype DX testing to determine if chemotherapy would be of any benefit followed by 2. Adjuvant radiation therapy followed by 3. Adjuvant antiestrogen therapy  RTC based on Oncotype Dx

## 2016-09-25 ENCOUNTER — Ambulatory Visit (HOSPITAL_BASED_OUTPATIENT_CLINIC_OR_DEPARTMENT_OTHER): Payer: Medicare Other | Admitting: Hematology and Oncology

## 2016-09-25 ENCOUNTER — Ambulatory Visit: Payer: Medicare Other | Admitting: Hematology and Oncology

## 2016-09-25 ENCOUNTER — Telehealth: Payer: Self-pay | Admitting: *Deleted

## 2016-09-25 ENCOUNTER — Telehealth: Payer: Self-pay | Admitting: Hematology and Oncology

## 2016-09-25 VITALS — BP 118/53 | HR 61 | Temp 97.7°F | Resp 18 | Ht 62.0 in | Wt 110.5 lb

## 2016-09-25 DIAGNOSIS — Z170 Estrogen receptor positive status [ER+]: Secondary | ICD-10-CM

## 2016-09-25 DIAGNOSIS — C50411 Malignant neoplasm of upper-outer quadrant of right female breast: Secondary | ICD-10-CM

## 2016-09-25 NOTE — Telephone Encounter (Signed)
Patient has an appointment today and was not feeling well.  She states that she has a sore thoart and does not want to bring germs here.  She is rescheduled for 6/25

## 2016-09-25 NOTE — Assessment & Plan Note (Signed)
09/18/16: Rt Lumpectomy: IDC grade 2, 1.7 cm, 0/1 LN Neg, Er 100%, PR 100%, Her 2 Neg, T1CN0 Stage 1A  Pathology counseling: I discussed the final pathology report of the patient provided  a copy of this report. I discussed the margins as well as lymph node surgeries. We also discussed the final staging along with previously performed ER/PR and HER-2/neu testing.  Treatment Plan: 1. Oncotype DX testing to determine if chemotherapy would be of any benefit followed by 2. Adjuvant radiation therapy followed by 3. Adjuvant antiestrogen therapy  RTC based on Oncotype Dx

## 2016-09-25 NOTE — Telephone Encounter (Signed)
Ordered oncotpe per Dr. Lindi Adie.  Faxed requisition to pathology and confirmed receipt.

## 2016-09-25 NOTE — Progress Notes (Signed)
Patient Care Team: Nicola Girt, DO as PCP - General (Internal Medicine) Nicola Girt, DO  DIAGNOSIS:  Encounter Diagnosis  Name Primary?  . Malignant neoplasm of upper-outer quadrant of right breast in female, estrogen receptor positive ([place]) Yes    SUMMARY OF ONCOLOGIC HISTORY:   Malignant neoplasm of upper-outer quadrant of right breast in female, estrogen receptor positive ([place])   08/09/2016 Initial Diagnosis    Right breast mass 11:00 position: 1.7 x 1.2 x 1.4 cm, no axillary lymph nodes; biopsy revealed invasive ductal carcinoma ER 100%, PR 100%, Ki-67 10%, HER-2 negative ratio 1.27, grade 1-2, T1c N0 stage IA clinical stage      09/18/2016 Surgery    Rt Lumpectomy: IDC grade 2, 1.7 cm, 0/1 LN Neg, Er 100%, PR 100%, Her 2 Neg, T1CN0 Stage 1A       CHIEF COMPLIANT: Follow-up after right lumpectomy  INTERVAL HISTORY: Crystal Tate is a 76 year old with above-mentioned history of right breast cancer underwent recent right lumpectomy and is here today to discuss pathology report. She is recovering very well from the recent surgery. She denies any pain or discomfort. She does have mild discomfort underneath the axilla.  REVIEW OF SYSTEMS:   Constitutional: Denies fevers, chills or abnormal weight loss Eyes: Denies blurriness of vision Ears, nose, mouth, throat, and face: Denies mucositis or sore throat Respiratory: Denies cough, dyspnea or wheezes Cardiovascular: Denies palpitation, chest discomfort Gastrointestinal:  Denies nausea, heartburn or change in bowel habits Skin: Denies abnormal skin rashes Lymphatics: Denies new lymphadenopathy or easy bruising Neurological:Denies numbness, tingling or new weaknesses Behavioral/Psych: Mood is stable, no new changes  Extremities: No lower extremity edema Breast:  Recent right lumpectomy and sentinel lymph node biopsy All other systems were reviewed with the patient and are negative.  I have reviewed the past medical  history, past surgical history, social history and family history with the patient and they are unchanged from previous note.  ALLERGIES:  is allergic to other.  MEDICATIONS:  Current Outpatient Prescriptions  Medication Sig Dispense Refill  . aspirin 81 MG tablet Take 81 mg by mouth daily.      . Biotin (PA BIOTIN) 1000 MCG tablet Take 5,000 mcg by mouth daily.     . calcium citrate-vitamin D (CITRACAL+D) 315-200 MG-UNIT per tablet Take 1 tablet by mouth 2 (two) times daily.    . Cyanocobalamin (VITAMIN B-12 PO) Take 1 tablet by mouth daily.    Marland Kitchen esomeprazole (NEXIUM) 20 MG capsule Take 20 mg by mouth daily at 12 noon.    . fish oil-omega-3 fatty acids 1000 MG capsule Take 2 g by mouth daily.      . Flaxseed, Linseed, (FLAX SEEDS PO) Take 1 tablet by mouth daily.     Marland Kitchen HYDROcodone-acetaminophen (NORCO/VICODIN) 5-325 MG tablet Take 1-2 tablets by mouth every 6 (six) hours as needed for moderate pain. 15 tablet 0  . ibuprofen (ADVIL,MOTRIN) 800 MG tablet Take 1 tablet (800 mg total) by mouth every 8 (eight) hours as needed. 30 tablet 0  . pravastatin (PRAVACHOL) 10 MG tablet Take 10 mg by mouth daily.      . valACYclovir (VALTREX) 1000 MG tablet Take 1 tablet by mouth as needed.   0   No current facility-administered medications for this visit.     PHYSICAL EXAMINATION: ECOG PERFORMANCE STATUS: 1 - Symptomatic but completely ambulatory  There were no vitals filed for this visit. There were no vitals filed for this visit.  GENERAL:alert, no distress  and comfortable SKIN: skin color, texture, turgor are normal, no rashes or significant lesions EYES: normal, Conjunctiva are pink and non-injected, sclera clear OROPHARYNX:no exudate, no erythema and lips, buccal mucosa, and tongue normal  NECK: supple, thyroid normal size, non-tender, without nodularity LYMPH:  no palpable lymphadenopathy in the cervical, axillary or inguinal LUNGS: clear to auscultation and percussion with normal  breathing effort HEART: regular rate & rhythm and no murmurs and no lower extremity edema ABDOMEN:abdomen soft, non-tender and normal bowel sounds MUSCULOSKELETAL:no cyanosis of digits and no clubbing  NEURO: alert & oriented x 3 with fluent speech, no focal motor/sensory deficits EXTREMITIES: No lower extremity edema  LABORATORY DATA:  I have reviewed the data as listed   Chemistry      Component Value Date/Time   NA 139 09/08/2016 0845   K 4.5 09/08/2016 0845   CL 104 09/08/2016 0845   CO2 28 09/08/2016 0845   BUN 9 09/08/2016 0845   CREATININE 0.79 09/08/2016 0845      Component Value Date/Time   CALCIUM 9.3 09/08/2016 0845   ALKPHOS 30 (L) 09/08/2016 0845   AST 20 09/08/2016 0845   ALT 19 09/08/2016 0845   BILITOT 0.5 09/08/2016 0845       Lab Results  Component Value Date   WBC 4.8 09/08/2016   HGB 13.8 09/08/2016   HCT 42.4 09/08/2016   MCV 96.4 09/08/2016   PLT 332 09/08/2016   NEUTROABS 2.5 09/08/2016    ASSESSMENT & PLAN:  Malignant neoplasm of upper-outer quadrant of right breast in female, estrogen receptor positive ([place]) 09/18/16: Rt Lumpectomy: IDC grade 2, 1.7 cm, 0/1 LN Neg, Er 100%, PR 100%, Her 2 Neg, T1CN0 Stage 1A  Pathology counseling: I discussed the final pathology report of the patient provided  a copy of this report. I discussed the margins as well as lymph node surgeries. We also discussed the final staging along with previously performed ER/PR and HER-2/neu testing.  Treatment Plan: 1. Oncotype DX testing to determine if chemotherapy would be of any benefit followed by 2. Adjuvant radiation therapy followed by 3. Adjuvant antiestrogen therapy  RTC based on Oncotype Dx   I spent 25 minutes talking to the patient of which more than half was spent in counseling and coordination of care.  No orders of the defined types were placed in this encounter.  The patient has a good understanding of the overall plan. she agrees with it. she will  call with any problems that may develop before the next visit here.   Rulon Eisenmenger, MD 09/25/16

## 2016-10-07 ENCOUNTER — Telehealth: Payer: Self-pay | Admitting: Surgery

## 2016-10-07 NOTE — Telephone Encounter (Signed)
Crystal Tate had a right breast lumpectomy on 09/14/2016.  She called about some increased swelling and redness to the lumpectomy wound over the last 24 horus.  She does not have a fever.  I told her to call the office Monday AM to be seen by Dr. Brantley Stage or in the urgent office.  She'll call back if this gets worse.  Alphonsa Overall, MD, [facility] Surgery Pager: [phone] Office phone:  [phone]

## 2016-10-09 ENCOUNTER — Telehealth: Payer: Self-pay | Admitting: *Deleted

## 2016-10-09 NOTE — Telephone Encounter (Signed)
Received oncotype results of 10.  Spoke with patient to inform her of results.  Let her know she will get a call for an appointment back with Dr. Lisbeth Renshaw and Dr. Lindi Adie will see her after XRT complete.

## 2016-10-11 ENCOUNTER — Encounter (HOSPITAL_COMMUNITY): Payer: Self-pay

## 2016-10-13 ENCOUNTER — Telehealth: Payer: Self-pay | Admitting: Emergency Medicine

## 2016-10-13 NOTE — Telephone Encounter (Signed)
Patient called for results from her breast tissue. Will follow up with breast navigator. Patient also inquiring about follow up appointment with Dr Lindi Adie. Will follow up with Dr Lindi Adie when he returns to the office 10/16/16.

## 2016-10-16 ENCOUNTER — Ambulatory Visit: Payer: Medicare Other | Admitting: Hematology and Oncology

## 2016-10-17 ENCOUNTER — Telehealth: Payer: Self-pay | Admitting: *Deleted

## 2016-10-17 NOTE — Telephone Encounter (Signed)
Spoke with patient about questions she had about her results. Informed we had discussed her oncotype results already.  Patient admits she didn't take good notes and forgot.  No further questions at this time.

## 2016-11-13 NOTE — Progress Notes (Addendum)
Location of Breast Cancer: Right Breast  11:00 position Upper Outer Quadrant   FUN after surgery to move forward with the simulation and planning process and anticipate starting radiotherapy about 4 weeks after surgery.   Histology per Pathology Report: Diagnosis 4/18/218: Breast, right, needle core biopsy, 11 o'clock - INVASIVE DUCTAL CARCINOMA  Receptor Status: ER(100%+), PR (100%+), Her2-neu (neg ratio=1.27), Ki-67(10%)  Did patient present with symptoms (if so, please note symptoms) or was this found on screening mammography?: Routine screening,   Past/Anticipated interventions by surgeon, if any: 09/14/2016 Procedure: RADIOACTIVE SEED GUIDED PARTIAL MASTECTOMY WITH AXILLARY SENTINEL LYMPH NODE BIOPSY;  Surgeon: Erroll Luna, MD;  Location: [facility];  Service: General;  Laterality: Right;   Past/Anticipated interventions by medical oncology, if any: Chemotherapy : Patient to see RAD ONC first and then follow up with Dr. Lindi Adie to discuss Oncotype results.  Lymphedema issues, if any: No  Pain issues, if any: No pain in right breast; fluid drawn from the breast four times starting September 29, 2016 and the last time 11-20-16 Dr. Marcello Moores Cornett called the fluid " muddy oil ".  Denies swelling to her right breast.  SAFETY ISSUES: NO  Prior radiation? NO  Pacemaker/ICD? NO Is the patient on methotrexate? NO Current Complaints / other details:  HX Squamous cell ,left leg,Widowed, no tobqaco use, drinks 7 glasses wine per week,  Father oral cancer, Son Melanoma Wt Readings from Last 3 Encounters:  11/21/16 111 lb 3.2 oz (50.4 kg)  09/25/16 110 lb 8 oz (50.1 kg)  09/14/16 108 lb (49 kg)  BP (!) 109/50   Pulse 66   Temp 98.4 F (36.9 C) (Oral)   Resp 16   Ht '5\' 2"'  (1.575 m)   Wt 111 lb 3.2 oz (50.4 kg)   LMP 02/28/1999   SpO2 100%   BMI 20.34 kg/m

## 2016-11-21 ENCOUNTER — Encounter: Payer: Self-pay | Admitting: Radiation Oncology

## 2016-11-21 ENCOUNTER — Ambulatory Visit
Admission: RE | Admit: 2016-11-21 | Discharge: 2016-11-21 | Disposition: A | Discharge status: Home / Self Care | Payer: Medicare Other | Source: Ambulatory Visit | Attending: Radiation Oncology | Admitting: Radiation Oncology

## 2016-11-21 VITALS — BP 109/50 | HR 66 | Temp 98.4°F | Resp 16 | Ht 62.0 in | Wt 111.2 lb

## 2016-11-21 DIAGNOSIS — C50411 Malignant neoplasm of upper-outer quadrant of right female breast: Secondary | ICD-10-CM

## 2016-11-21 DIAGNOSIS — Z170 Estrogen receptor positive status [ER+]: Principal | ICD-10-CM

## 2016-11-21 NOTE — Progress Notes (Signed)
Radiation Oncology         [phone] ________________________________  Name: Crystal Tate MRN: [id number]  Date: 11/21/2016  DOB: Jul 20, 1940  SJ:[id number], Crystal Ehlers, DO  Crystal Lose, MD     REFERRING PHYSICIAN: Nicholas Lose, MD   DIAGNOSIS: The encounter diagnosis was Malignant neoplasm of upper-outer quadrant of right breast in female, estrogen receptor positive ([place]).   HISTORY OF PRESENT ILLNESS: Crystal Tate is a 76 y.o. female previously seen after a new diagnosis of breast cancer. The patient underwent a routine screening mammogram on 07/13/16. This revealed a possible mass in the right breast warranting further evaluation. Ultrasound on 08/08/16 showed a 1.7 x 1.2 x 1.4 cm irregular mass at the 11 o'clock position, 5 cm from the nipple in the upper outer quadrant of the right breast. 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 Receptor status was ER 100%, PR 100%, HER2 -, and Ki67 of 10%.   She underwent right lumpectomy with sentinel node assessment on 09/14/16.final pathology revealed a 1.7 cm tumor, grade 2, with negative margins and one node that was negative for disease. She had an oncotype test that was 10 and she is not planning on chemotherapy. She comes today to discuss options of radiotherapy. She has had several occurences of having a seroma cavity drained. Yesterday was the 4th time this had been performed.    PREVIOUS RADIATION THERAPY: No   PAST MEDICAL HISTORY:  Past Medical History:  Diagnosis Date  . Arthritis    hands  . Cancer (HCC)    SQUAMOS CELL , LEFT LEG  . Complication of anesthesia   . Headache   . Heart burn   . High cholesterol   . PONV (postoperative nausea and vomiting)   . Pre-diabetes        PAST SURGICAL HISTORY: Past Surgical History:  Procedure Laterality Date  . INNER EAR SURGERY     LEFT..   . RADIOACTIVE SEED GUIDED MASTECTOMY WITH AXILLARY SENTINEL LYMPH NODE BIOPSY Right  09/14/2016   Procedure: RADIOACTIVE SEED GUIDED PARTIAL MASTECTOMY WITH AXILLARY SENTINEL LYMPH NODE BIOPSY;  Surgeon: Erroll Luna, MD;  Location: [facility];  Service: General;  Laterality: Right;  . SKIN CANCER EXCISION  2010   LEFT LEG, SQUAMOS CELL,  . TONSILLECTOMY AND ADENOIDECTOMY    . TUBAL LIGATION    . VEIN SURGERY       FAMILY HISTORY:  Family History  Problem Relation Age of Onset  . Cancer Father        ORAL   . Cancer Son 34       MELANOMA  . Breast cancer Neg Hx      SOCIAL HISTORY:  reports that she has never smoked. She has never used smokeless tobacco. She reports that she drinks about 4.2 oz of alcohol per week . She reports that she does not use drugs. The patient is widowed and lives in [place]. 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   ALLERGIES: Other   MEDICATIONS:  Current Outpatient Prescriptions  Medication Sig Dispense Refill  . aspirin 81 MG tablet Take 81 mg by mouth daily.      . calcium citrate-vitamin D (CITRACAL+D) 315-200 MG-UNIT per tablet Take 1 tablet by mouth 2 (two) times daily.    . Cyanocobalamin (VITAMIN B-12 PO) Take 1 tablet by mouth daily.    Marland Kitchen  esomeprazole (NEXIUM) 20 MG capsule Take 20 mg by mouth daily at 12 noon.    . fish oil-omega-3 fatty acids 1000 MG capsule Take 2 g by mouth daily.      . Flaxseed, Linseed, (FLAX SEEDS PO) Take 1 tablet by mouth daily.     . Biotin (PA BIOTIN) 1000 MCG tablet Take 5,000 mcg by mouth daily.     Marland Kitchen ibuprofen (ADVIL,MOTRIN) 800 MG tablet Take 1 tablet (800 mg total) by mouth every 8 (eight) hours as needed. (Patient not taking: Reported on 11/21/2016) 30 tablet 0  . pravastatin (PRAVACHOL) 10 MG tablet Take 10 mg by mouth daily.      . valACYclovir (VALTREX) 1000 MG tablet Take 1 tablet by mouth as needed.   0   No current facility-administered medications for this encounter.      REVIEW OF SYSTEMS: On review of systems, the  patient reports that she is doing well overall. She has noticed some fullness in her right breast overall and describes this as firm at times, and uncomfortable when she moves her right arm or in lying flat. She denies any chest pain, shortness of breath, cough, fevers, chills, night sweats, unintended weight changes. She denies any bowel or bladder disturbances, and denies abdominal pain, nausea or vomiting. She denies any new musculoskeletal or joint aches or pains. A complete review of systems is obtained and is otherwise negative.     PHYSICAL EXAM:  Wt Readings from Last 3 Encounters:  11/21/16 111 lb 3.2 oz (50.4 kg)  09/25/16 110 lb 8 oz (50.1 kg)  09/14/16 108 lb (49 kg)   Temp Readings from Last 3 Encounters:  11/21/16 98.4 F (36.9 C) (Oral)  09/25/16 97.7 F (36.5 C) (Oral)  09/14/16 97.4 F (36.3 C)   BP Readings from Last 3 Encounters:  11/21/16 (!) 109/50  09/25/16 (!) 118/53  09/14/16 122/61   Pulse Readings from Last 3 Encounters:  11/21/16 66  09/25/16 61  09/14/16 70   Pain Assessment Pain Score: 0-No pain/10  In general this is a well appearing caucasian woman in no acute distress. She is alert and oriented x4 and appropriate throughout the examination. HEENT reveals that the patient is normocephalic, atraumatic. EOMs are intact. PERRLA. Cardiopulmonary assessment is negative for acute distress and she exhibits normal effort. The right breast is assessed and reveals a fullness in the upper outer quadrant of the right breast above a well healed lumpectomy scar. There is mild fluctuance consistent with seroma, and the size of this overall is about 4-5 cm. No erythema is noted. Lower extremities are negative for pretibial pitting edema, deep calf tenderness, cyanosis or clubbing. No evidence of RUE edema is noted.   ECOG = 0  0 - Asymptomatic (Fully active, able to carry on all predisease activities without restriction)  1 - Symptomatic but completely ambulatory  (Restricted in physically strenuous activity but ambulatory and able to carry out work of a light or sedentary nature. For example, light housework, office work)  2 - Symptomatic, <50% in bed during the day (Ambulatory and capable of all self care but unable to carry out any work activities. Up and about more than 50% of waking hours)  3 - Symptomatic, >50% in bed, but not bedbound (Capable of only limited self-care, confined to bed or chair 50% or more of waking hours)  4 - Bedbound (Completely disabled. Cannot carry on any self-care. Totally confined to bed or chair)  5 - Death  Oken MM, Creech RH, Tormey DC, et al. [phone]). "Toxicity and response criteria of the [facility] Group". [place] Oncol. 5 (6): 649-55    LABORATORY DATA:  Lab Results  Component Value Date   WBC 4.8 09/08/2016   HGB 13.8 09/08/2016   HCT 42.4 09/08/2016   MCV 96.4 09/08/2016   PLT 332 09/08/2016   Lab Results  Component Value Date   NA 139 09/08/2016   K 4.5 09/08/2016   CL 104 09/08/2016   CO2 28 09/08/2016   Lab Results  Component Value Date   ALT 19 09/08/2016   AST 20 09/08/2016   ALKPHOS 30 (L) 09/08/2016   BILITOT 0.5 09/08/2016      RADIOGRAPHY: No results found.     IMPRESSION/PLAN: 1. 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 Dr. Lisbeth Renshaw reviews the patient's final pathology findings and reviews the nature of invasive breast disease.at this point we would like to offer external radiotherapy to the breast. We discussed the risks, benefits, short, and long term effects of radiotherapy, and the patient is interested in proceeding. Dr. Lisbeth Renshaw discusses the delivery and logistics of radiotherapy and again offers a course of 4 weeks of treatment. We will see her back in about a week and a half for simulation. Written consent is obtained and placed in the chart, a copy was provided to the  patient. 2. Right postop seroma. Unfortunately this has delayed her treatment, but we would still recommend proceeding as above. I will contact the patient next week to ensure resolution versus stability of this site so that we may move forward.  In a visit lasting 25 minutes, greater than 50% of the time was spent face to face discussing the implications of how her seroma cavity impacts treatment, and coordinating the patient's care.   The above documentation reflects my direct findings during this shared patient visit. Please see the separate note by Dr. Lisbeth Renshaw on this date for the remainder of the patient's plan of care.    Carola Rhine, PAC  This document serves as a record of services personally performed by Kyung Rudd, MD and Shona Simpson, PA-C. It was created on their behalf by Bethann Humble, a trained medical scribe. The creation of this record is based on the scribe's personal observations and the provider's statements to them. This document has been checked and approved by the attending provider.

## 2016-11-30 ENCOUNTER — Telehealth: Payer: Self-pay | Admitting: Radiation Oncology

## 2016-11-30 NOTE — Telephone Encounter (Addendum)
LM to see how pt's seroma is doing. I asked her to call me back to discuss

## 2016-11-30 NOTE — Telephone Encounter (Signed)
Pt called back and said seroma/hematoma has improved and she will come back Tuesday next week for simulation. I called her to confirm these plans.

## 2016-12-05 ENCOUNTER — Telehealth: Payer: Self-pay | Admitting: Gynecology

## 2016-12-05 ENCOUNTER — Ambulatory Visit
Admission: RE | Admit: 2016-12-05 | Discharge: 2016-12-05 | Disposition: A | Discharge status: Home / Self Care | Payer: Medicare Other | Source: Ambulatory Visit | Attending: Radiation Oncology | Admitting: Radiation Oncology

## 2016-12-05 DIAGNOSIS — Z170 Estrogen receptor positive status [ER+]: Secondary | ICD-10-CM | POA: Diagnosis not present

## 2016-12-05 DIAGNOSIS — Z510 Encounter for antineoplastic radiation therapy: Secondary | ICD-10-CM | POA: Diagnosis present

## 2016-12-05 DIAGNOSIS — C50411 Malignant neoplasm of upper-outer quadrant of right female breast: Secondary | ICD-10-CM | POA: Diagnosis present

## 2016-12-06 NOTE — Progress Notes (Signed)
  Radiation Oncology         (336) [phone] ________________________________  Name: Crystal Tate MRN: [id number]  Date: 12/05/2016  DOB: [birth year]  Optical Surface Tracking Plan:  Since intensity modulated radiotherapy (IMRT) and 3D conformal radiation treatment methods are predicated on accurate and precise positioning for treatment, intrafraction motion monitoring is medically necessary to ensure accurate and safe treatment delivery.  The ability to quantify intrafraction motion without excessive ionizing radiation dose can only be performed with optical surface tracking. Accordingly, surface imaging offers the opportunity to obtain 3D measurements of patient position throughout IMRT and 3D treatments without excessive radiation exposure.  I am ordering optical surface tracking for this patient's upcoming course of radiotherapy. ________________________________  Kyung Rudd, MD 12/06/2016 3:47 PM    Reference:   Ursula Alert, J, et al. Surface imaging-based analysis of intrafraction motion for breast radiotherapy patients.Journal of [place], n. 6, nov. 2014. ISSN [id number].   Available at: <http://www.jacmp.org/index.php/jacmp/article/view/4957>.

## 2016-12-06 NOTE — Progress Notes (Signed)
  Radiation Oncology         [phone]) [phone] ________________________________  Name: Crystal Tate MRN: [id number]  Date: 12/05/2016  DOB: Mar 03, 1941   DIAGNOSIS:     ICD-10-CM   1. Malignant neoplasm of upper-outer quadrant of right breast in female, estrogen receptor positive ([place]) C50.411    Z17.0     SIMULATION AND TREATMENT PLANNING NOTE  The patient presented for simulation prior to beginning her course of radiation treatment for her diagnosis of right-sided breast cancer. The patient was placed in a supine position on a breast board. A customized vac-lock bag was constructed and this complex treatment device will be used on a daily basis during her treatment. In this fashion, a CT scan was obtained through the chest area and an isocenter was placed near the chest wall within the breast.  The patient will be planned to receive a course of radiation initially to a dose of 42.5 Gy. This will consist of a whole breast radiotherapy technique. To accomplish this, 2 customized blocks have been designed which will correspond to medial and lateral whole breast tangent fields. This treatment will be accomplished at 2.5 Gy per fraction. A forward planning technique will also be evaluated to determine if this approach improves the plan. It is anticipated that the patient will then receive a 7.5 Gy boost to the seroma cavity which has been contoured. This will be accomplished at 2.5 Gy per fraction.   This initial treatment will consist of a 3-D conformal technique. The seroma has been contoured as the primary target structure. Additionally, dose volume histograms of both this target as well as the lungs and heart will also be evaluated. Such an approach is necessary to ensure that the target area is adequately covered while the nearby critical  normal structures are adequately spared.  Plan:  The final anticipated total dose therefore will correspond to 50 Gy.    _______________________________   Jodelle Gross, MD, PhD

## 2016-12-07 ENCOUNTER — Telehealth: Payer: Self-pay | Admitting: Hematology and Oncology

## 2016-12-07 DIAGNOSIS — Z510 Encounter for antineoplastic radiation therapy: Secondary | ICD-10-CM | POA: Diagnosis not present

## 2016-12-07 NOTE — Telephone Encounter (Signed)
sw pt to confirm 9/19 appt at 245 per sch msg

## 2016-12-12 ENCOUNTER — Ambulatory Visit
Admission: RE | Admit: 2016-12-12 | Discharge: 2016-12-12 | Disposition: A | Discharge status: Home / Self Care | Payer: Medicare Other | Source: Ambulatory Visit | Attending: Radiation Oncology | Admitting: Radiation Oncology

## 2016-12-12 DIAGNOSIS — Z510 Encounter for antineoplastic radiation therapy: Secondary | ICD-10-CM | POA: Diagnosis not present

## 2016-12-12 NOTE — Telephone Encounter (Addendum)
PC to pt Prolia due 12/08/16. Calcium 9.3 09/08/16. No PA, Co insurance 20% approx $216.00 Co pay with/without visit $50 , Pt has new dx breast cancer with Radiation for four weeks, I told her that I will discuss with N Young  Prior to this injection due to may hold off Prolia until after [place] are finished. I will return her call or N Young will call regarding next step for Prolia.

## 2016-12-13 ENCOUNTER — Ambulatory Visit
Admission: RE | Admit: 2016-12-13 | Discharge: 2016-12-13 | Disposition: A | Discharge status: Home / Self Care | Payer: Medicare Other | Source: Ambulatory Visit | Attending: Radiation Oncology | Admitting: Radiation Oncology

## 2016-12-13 DIAGNOSIS — C50411 Malignant neoplasm of upper-outer quadrant of right female breast: Secondary | ICD-10-CM

## 2016-12-13 DIAGNOSIS — Z170 Estrogen receptor positive status [ER+]: Principal | ICD-10-CM

## 2016-12-13 DIAGNOSIS — Z510 Encounter for antineoplastic radiation therapy: Secondary | ICD-10-CM | POA: Diagnosis not present

## 2016-12-13 MED ORDER — RADIAPLEXRX EX GEL
Freq: Once | CUTANEOUS | Status: AC
  Administered 2016-12-13: 14:00:00 via TOPICAL

## 2016-12-13 NOTE — Progress Notes (Signed)
Pt education done, radiation therapy and you book, radiaplex, my business card, radiaplex cream, apply radiaplex to skin affected after rad txs  And at bedtime daly, increase protein  In diet, drink plenty water,stay hydrated,electric shaver  If shaving, no under wire bra, use unscented soap,dove preferred, luke warm shower/bath, no rubbing,scrubbing or scratching ,pat dry, sees MD weekly and prn.teach back given 3:30 PM

## 2016-12-13 NOTE — Telephone Encounter (Signed)
I think it best to wait until after her radiation is over and then proceed with Prolia.

## 2016-12-13 NOTE — Telephone Encounter (Signed)
Called pt she will call back when she is finished with the Radiation Treatments.

## 2016-12-14 ENCOUNTER — Ambulatory Visit
Admission: RE | Admit: 2016-12-14 | Discharge: 2016-12-14 | Disposition: A | Discharge status: Home / Self Care | Payer: Medicare Other | Source: Ambulatory Visit | Attending: Radiation Oncology | Admitting: Radiation Oncology

## 2016-12-14 DIAGNOSIS — Z510 Encounter for antineoplastic radiation therapy: Secondary | ICD-10-CM | POA: Diagnosis not present

## 2016-12-15 ENCOUNTER — Ambulatory Visit
Admission: RE | Admit: 2016-12-15 | Discharge: 2016-12-15 | Disposition: A | Discharge status: Home / Self Care | Payer: Medicare Other | Source: Ambulatory Visit | Attending: Radiation Oncology | Admitting: Radiation Oncology

## 2016-12-15 DIAGNOSIS — Z510 Encounter for antineoplastic radiation therapy: Secondary | ICD-10-CM | POA: Diagnosis not present

## 2016-12-18 ENCOUNTER — Ambulatory Visit
Admission: RE | Admit: 2016-12-18 | Discharge: 2016-12-18 | Disposition: A | Discharge status: Home / Self Care | Payer: Medicare Other | Source: Ambulatory Visit | Attending: Radiation Oncology | Admitting: Radiation Oncology

## 2016-12-18 DIAGNOSIS — Z510 Encounter for antineoplastic radiation therapy: Secondary | ICD-10-CM | POA: Diagnosis not present

## 2016-12-19 ENCOUNTER — Ambulatory Visit
Admission: RE | Admit: 2016-12-19 | Discharge: 2016-12-19 | Disposition: A | Discharge status: Home / Self Care | Payer: Medicare Other | Source: Ambulatory Visit | Attending: Radiation Oncology | Admitting: Radiation Oncology

## 2016-12-19 ENCOUNTER — Encounter: Payer: Self-pay | Admitting: Anesthesiology

## 2016-12-19 DIAGNOSIS — C50411 Malignant neoplasm of upper-outer quadrant of right female breast: Secondary | ICD-10-CM

## 2016-12-19 DIAGNOSIS — Z170 Estrogen receptor positive status [ER+]: Principal | ICD-10-CM

## 2016-12-19 DIAGNOSIS — Z510 Encounter for antineoplastic radiation therapy: Secondary | ICD-10-CM | POA: Diagnosis not present

## 2016-12-19 MED ORDER — ALRA NON-METALLIC DEODORANT (RAD-ONC)
1.0000 "application " | Freq: Once | TOPICAL | Status: AC
  Administered 2016-12-19: 1 via TOPICAL

## 2016-12-20 ENCOUNTER — Ambulatory Visit
Admission: RE | Admit: 2016-12-20 | Discharge: 2016-12-20 | Disposition: A | Discharge status: Home / Self Care | Payer: Medicare Other | Source: Ambulatory Visit | Attending: Radiation Oncology | Admitting: Radiation Oncology

## 2016-12-20 DIAGNOSIS — Z510 Encounter for antineoplastic radiation therapy: Secondary | ICD-10-CM | POA: Diagnosis not present

## 2016-12-21 ENCOUNTER — Ambulatory Visit
Admission: RE | Admit: 2016-12-21 | Discharge: 2016-12-21 | Disposition: A | Discharge status: Home / Self Care | Payer: Medicare Other | Source: Ambulatory Visit | Attending: Radiation Oncology | Admitting: Radiation Oncology

## 2016-12-21 DIAGNOSIS — Z510 Encounter for antineoplastic radiation therapy: Secondary | ICD-10-CM | POA: Diagnosis not present

## 2016-12-22 ENCOUNTER — Ambulatory Visit
Admission: RE | Admit: 2016-12-22 | Discharge: 2016-12-22 | Disposition: A | Discharge status: Home / Self Care | Payer: Medicare Other | Source: Ambulatory Visit | Attending: Radiation Oncology | Admitting: Radiation Oncology

## 2016-12-22 DIAGNOSIS — Z510 Encounter for antineoplastic radiation therapy: Secondary | ICD-10-CM | POA: Diagnosis not present

## 2016-12-26 ENCOUNTER — Ambulatory Visit
Admission: RE | Admit: 2016-12-26 | Discharge: 2016-12-26 | Disposition: A | Discharge status: Home / Self Care | Payer: Medicare Other | Source: Ambulatory Visit | Attending: Radiation Oncology | Admitting: Radiation Oncology

## 2016-12-26 DIAGNOSIS — Z510 Encounter for antineoplastic radiation therapy: Secondary | ICD-10-CM | POA: Diagnosis not present

## 2016-12-27 ENCOUNTER — Ambulatory Visit
Admission: RE | Admit: 2016-12-27 | Discharge: 2016-12-27 | Disposition: A | Discharge status: Home / Self Care | Payer: Medicare Other | Source: Ambulatory Visit | Attending: Radiation Oncology | Admitting: Radiation Oncology

## 2016-12-27 DIAGNOSIS — Z510 Encounter for antineoplastic radiation therapy: Secondary | ICD-10-CM | POA: Diagnosis not present

## 2016-12-28 ENCOUNTER — Ambulatory Visit
Admission: RE | Admit: 2016-12-28 | Discharge: 2016-12-28 | Disposition: A | Discharge status: Home / Self Care | Payer: Medicare Other | Source: Ambulatory Visit | Attending: Radiation Oncology | Admitting: Radiation Oncology

## 2016-12-28 DIAGNOSIS — Z510 Encounter for antineoplastic radiation therapy: Secondary | ICD-10-CM | POA: Diagnosis not present

## 2016-12-29 ENCOUNTER — Ambulatory Visit
Admission: RE | Admit: 2016-12-29 | Discharge: 2016-12-29 | Disposition: A | Discharge status: Home / Self Care | Payer: Medicare Other | Source: Ambulatory Visit | Attending: Radiation Oncology | Admitting: Radiation Oncology

## 2016-12-29 DIAGNOSIS — Z510 Encounter for antineoplastic radiation therapy: Secondary | ICD-10-CM | POA: Diagnosis not present

## 2017-01-01 ENCOUNTER — Ambulatory Visit
Admission: RE | Admit: 2017-01-01 | Discharge: 2017-01-01 | Disposition: A | Discharge status: Home / Self Care | Payer: Medicare Other | Source: Ambulatory Visit | Attending: Radiation Oncology | Admitting: Radiation Oncology

## 2017-01-01 DIAGNOSIS — Z510 Encounter for antineoplastic radiation therapy: Secondary | ICD-10-CM | POA: Diagnosis not present

## 2017-01-02 ENCOUNTER — Ambulatory Visit
Admission: RE | Admit: 2017-01-02 | Discharge: 2017-01-02 | Disposition: A | Discharge status: Home / Self Care | Payer: Medicare Other | Source: Ambulatory Visit | Attending: Radiation Oncology | Admitting: Radiation Oncology

## 2017-01-02 DIAGNOSIS — Z510 Encounter for antineoplastic radiation therapy: Secondary | ICD-10-CM | POA: Diagnosis not present

## 2017-01-03 ENCOUNTER — Ambulatory Visit
Admission: RE | Admit: 2017-01-03 | Discharge: 2017-01-03 | Disposition: A | Discharge status: Home / Self Care | Payer: Medicare Other | Source: Ambulatory Visit | Attending: Radiation Oncology | Admitting: Radiation Oncology

## 2017-01-03 DIAGNOSIS — Z510 Encounter for antineoplastic radiation therapy: Secondary | ICD-10-CM | POA: Diagnosis not present

## 2017-01-04 ENCOUNTER — Ambulatory Visit
Admission: RE | Admit: 2017-01-04 | Discharge: 2017-01-04 | Disposition: A | Discharge status: Home / Self Care | Payer: Medicare Other | Source: Ambulatory Visit | Attending: Radiation Oncology | Admitting: Radiation Oncology

## 2017-01-04 DIAGNOSIS — Z510 Encounter for antineoplastic radiation therapy: Secondary | ICD-10-CM | POA: Diagnosis not present

## 2017-01-05 ENCOUNTER — Ambulatory Visit
Admission: RE | Admit: 2017-01-05 | Discharge: 2017-01-05 | Disposition: A | Discharge status: Home / Self Care | Payer: Medicare Other | Source: Ambulatory Visit | Attending: Radiation Oncology | Admitting: Radiation Oncology

## 2017-01-05 DIAGNOSIS — Z510 Encounter for antineoplastic radiation therapy: Secondary | ICD-10-CM | POA: Diagnosis not present

## 2017-01-08 ENCOUNTER — Ambulatory Visit
Admission: RE | Admit: 2017-01-08 | Discharge: 2017-01-08 | Disposition: A | Discharge status: Home / Self Care | Payer: Medicare Other | Source: Ambulatory Visit | Attending: Radiation Oncology | Admitting: Radiation Oncology

## 2017-01-08 DIAGNOSIS — Z510 Encounter for antineoplastic radiation therapy: Secondary | ICD-10-CM | POA: Diagnosis not present

## 2017-01-09 ENCOUNTER — Ambulatory Visit
Admission: RE | Admit: 2017-01-09 | Discharge: 2017-01-09 | Disposition: A | Discharge status: Home / Self Care | Payer: Medicare Other | Source: Ambulatory Visit | Attending: Radiation Oncology | Admitting: Radiation Oncology

## 2017-01-09 DIAGNOSIS — Z510 Encounter for antineoplastic radiation therapy: Secondary | ICD-10-CM | POA: Diagnosis not present

## 2017-01-10 ENCOUNTER — Ambulatory Visit
Admission: RE | Admit: 2017-01-10 | Discharge: 2017-01-10 | Disposition: A | Discharge status: Home / Self Care | Payer: Medicare Other | Source: Ambulatory Visit | Attending: Radiation Oncology | Admitting: Radiation Oncology

## 2017-01-10 ENCOUNTER — Encounter: Payer: Self-pay | Admitting: Radiation Oncology

## 2017-01-10 ENCOUNTER — Ambulatory Visit (HOSPITAL_BASED_OUTPATIENT_CLINIC_OR_DEPARTMENT_OTHER): Payer: Medicare Other | Admitting: Hematology and Oncology

## 2017-01-10 ENCOUNTER — Telehealth: Payer: Self-pay

## 2017-01-10 DIAGNOSIS — L598 Other specified disorders of the skin and subcutaneous tissue related to radiation: Secondary | ICD-10-CM

## 2017-01-10 DIAGNOSIS — C50411 Malignant neoplasm of upper-outer quadrant of right female breast: Secondary | ICD-10-CM | POA: Diagnosis not present

## 2017-01-10 DIAGNOSIS — Z170 Estrogen receptor positive status [ER+]: Secondary | ICD-10-CM | POA: Diagnosis not present

## 2017-01-10 DIAGNOSIS — Z510 Encounter for antineoplastic radiation therapy: Secondary | ICD-10-CM | POA: Diagnosis not present

## 2017-01-10 MED ORDER — ANASTROZOLE 1 MG PO TABS: 1.0000 mg | ORAL_TABLET | Freq: Every day | ORAL | 3 refills | Status: DC

## 2017-01-10 NOTE — Progress Notes (Signed)
Patient Care Team: Nicola Girt, DO as PCP - General (Internal Medicine) Nicola Girt, DO  DIAGNOSIS:  Encounter Diagnosis  Name Primary?  . Malignant neoplasm of upper-outer quadrant of right breast in female, estrogen receptor positive ([place])     SUMMARY OF ONCOLOGIC HISTORY:   Malignant neoplasm of upper-outer quadrant of right breast in female, estrogen receptor positive ([place])   08/09/2016 Initial Diagnosis    Right breast mass 11:00 position: 1.7 x 1.2 x 1.4 cm, no axillary lymph nodes; biopsy revealed invasive ductal carcinoma ER 100%, PR 100%, Ki-67 10%, HER-2 negative ratio 1.27, grade 1-2, T1c N0 stage IA clinical stage      09/18/2016 Surgery    Rt Lumpectomy: IDC grade 2, 1.7 cm, 0/1 LN Neg, Er 100%, PR 100%, Her 2 Neg, T1CN0 Stage 1A      10/09/2016 Oncotype testing    Oncotype score 10; risk of recurrence 7%      12/13/2016 - 01/10/2017 Radiation Therapy    Adjuvant radiation       CHIEF COMPLIANT: Follow up after XRT  INTERVAL HISTORY: Crystal Tate is a 76 yr old with history of Right breast cancer S/P lumpectomy and XRT who is here after completing XRT. 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 She is here to talk about anti-estrogen therapy  REVIEW OF SYSTEMS:   Constitutional: Denies fevers, chills or abnormal weight loss Eyes: Denies blurriness of vision Ears, nose, mouth, throat, and face: Denies mucositis or sore throat Respiratory: Denies cough, dyspnea or wheezes Cardiovascular: Denies palpitation, chest discomfort Gastrointestinal:  Denies nausea, heartburn or change in bowel habits Skin: Denies abnormal skin rashes Lymphatics: Denies new lymphadenopathy or easy bruising Neurological:Denies numbness, tingling or new weaknesses Behavioral/Psych: Mood is stable, no new changes  Extremities: No lower extremity edema Breast: Radiation dermatitis All other systems were reviewed with the patient and are negative.  I have reviewed the past medical history,  past surgical history, social history and family history with the patient and they are unchanged from previous note.  ALLERGIES:  is allergic to other.  MEDICATIONS:  Current Outpatient Prescriptions  Medication Sig Dispense Refill  . anastrozole (ARIMIDEX) 1 MG tablet Take 1 tablet (1 mg total) by mouth daily. 90 tablet 3  . aspirin 81 MG tablet Take 81 mg by mouth daily.      . Biotin (PA BIOTIN) 1000 MCG tablet Take 5,000 mcg by mouth daily.     . calcium citrate-vitamin D (CITRACAL+D) 315-200 MG-UNIT per tablet Take 1 tablet by mouth 2 (two) times daily.    . Cyanocobalamin (VITAMIN B-12 PO) Take 1 tablet by mouth daily.    Marland Kitchen esomeprazole (NEXIUM) 20 MG capsule Take 20 mg by mouth daily at 12 noon.    . fish oil-omega-3 fatty acids 1000 MG capsule Take 2 g by mouth daily.      . Flaxseed, Linseed, (FLAX SEEDS PO) Take 1 tablet by mouth daily.     . hyaluronate sodium (RADIAPLEXRX) GEL Apply 1 application topically 2 (two) times daily. Apply to affected skin area after rad tx and at bedtimes daily, nothing 4 hours prior to rad txs    . ibuprofen (ADVIL,MOTRIN) 800 MG tablet Take 1 tablet (800 mg total) by mouth every 8 (eight) hours as needed. (Patient not taking: Reported on 11/21/2016) 30 tablet 0  . non-metallic deodorant (ALRA) MISC Apply 1 application topically daily as needed.    . pravastatin (PRAVACHOL) 10 MG tablet Take 10 mg by mouth daily.      Marland Kitchen  valACYclovir (VALTREX) 1000 MG tablet Take 1 tablet by mouth as needed.   0   No current facility-administered medications for this visit.     PHYSICAL EXAMINATION: ECOG PERFORMANCE STATUS:1  Vitals:   01/10/17 1445  BP: (!) 110/56  Pulse: 69  Resp: 18  Temp: 98.8 F (37.1 C)  SpO2: 99%   Filed Weights   01/10/17 1445  Weight: 112 lb 1.6 oz (50.8 kg)    GENERAL:alert, no distress and comfortable SKIN: skin color, texture, turgor are normal, no rashes or significant lesions EYES: normal, Conjunctiva are pink and  non-injected, sclera clear OROPHARYNX:no exudate, no erythema and lips, buccal mucosa, and tongue normal  NECK: supple, thyroid normal size, non-tender, without nodularity LYMPH:  no palpable lymphadenopathy in the cervical, axillary or inguinal LUNGS: clear to auscultation and percussion with normal breathing effort HEART: regular rate & rhythm and no murmurs and no lower extremity edema ABDOMEN:abdomen soft, non-tender and normal bowel sounds MUSCULOSKELETAL:no cyanosis of digits and no clubbing  NEURO: alert & oriented x 3 with fluent speech, no focal motor/sensory deficits EXTREMITIES: No lower extremity edema  LABORATORY DATA:  I have reviewed the data as listed   Chemistry      Component Value Date/Time   NA 139 09/08/2016 0845   K 4.5 09/08/2016 0845   CL 104 09/08/2016 0845   CO2 28 09/08/2016 0845   BUN 9 09/08/2016 0845   CREATININE 0.79 09/08/2016 0845      Component Value Date/Time   CALCIUM 9.3 09/08/2016 0845   ALKPHOS 30 (L) 09/08/2016 0845   AST 20 09/08/2016 0845   ALT 19 09/08/2016 0845   BILITOT 0.5 09/08/2016 0845       Lab Results  Component Value Date   WBC 4.8 09/08/2016   HGB 13.8 09/08/2016   HCT 42.4 09/08/2016   MCV 96.4 09/08/2016   PLT 332 09/08/2016   NEUTROABS 2.5 09/08/2016    ASSESSMENT & PLAN:  Malignant neoplasm of upper-outer quadrant of right breast in female, estrogen receptor positive ([place]) 09/18/16: Rt Lumpectomy: IDC grade 2, 1.7 cm, 0/1 LN Neg, Er 100%, PR 100%, Her 2 Neg, T1CN0 Stage 1A Oncotype score 10, risk of recurrence 7%, low risk Adjuvant radiation therapy 12/13/2016-01/10/2017  Treatment plan: Adjuvant antiestrogen therapy with anastrozole 1 mg by mouth daily 5 years  Anastrozole counseling:We discussed the risks and benefits of anti-estrogen therapy with aromatase inhibitors. These include but not limited to insomnia, hot flashes, mood changes, vaginal dryness, bone density loss, and weight gain. We strongly  believe that the benefits far outweigh the risks. Patient understands these risks and consented to starting treatment. Planned treatment duration is 5 years.  Return to clinic in 3 months for survivorship care plan visit.  I spent 25 minutes talking to the patient of which more than half was spent in counseling and coordination of care.  No orders of the defined types were placed in this encounter.  The patient has a good understanding of the overall plan. she agrees with it. she will call with any problems that may develop before the next visit here.   Rulon Eisenmenger, MD 01/10/17

## 2017-01-10 NOTE — Telephone Encounter (Signed)
Spoke with patient on phone. Will confirm  appointment for 12/20 after her scheduled appointment for today. Time can be change at patient request. Per 9/19 message

## 2017-01-10 NOTE — Assessment & Plan Note (Signed)
09/18/16: Rt Lumpectomy: IDC grade 2, 1.7 cm, 0/1 LN Neg, Er 100%, PR 100%, Her 2 Neg, T1CN0 Stage 1A Oncotype score 10, risk of recurrence 7%, low risk Adjuvant radiation therapy 12/13/2016-01/10/2017  Treatment plan: Adjuvant antiestrogen therapy with anastrozole 1 mg by mouth daily 5 years  Anastrozole counseling:We discussed the risks and benefits of anti-estrogen therapy with aromatase inhibitors. These include but not limited to insomnia, hot flashes, mood changes, vaginal dryness, bone density loss, and weight gain. We strongly believe that the benefits far outweigh the risks. Patient understands these risks and consented to starting treatment. Planned treatment duration is 5 years.  Return to clinic in 3 months for survivorship care plan was

## 2017-01-17 NOTE — Progress Notes (Signed)
  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       ICD-10-CM   1. Malignant neoplasm of upper-outer quadrant of right breast in female, estrogen receptor positive ([place]) C50.411    Z17.0     Indication for treatment:  Curative   Radiation treatment dates:  12/13/2016 to 01/10/2017  Site/dose:    1. The Right breast was treated to 42.5 Gy in 17 fractions at 2.5 Gy per fraction. 2. The Right breast was boosted to 7.5 Gy in 3 fractions at 2.5 Gy per fraction.  Beams/energy:    1. 3D // 6X 2. Electron boost // 15E  Narrative: The patient tolerated radiation treatment relatively well.   She developed mild to moderate erythema in the treatment area with no significant desquamation. Using skin cream as directed. Denies any significant increase in skin irritation.   Plan: The patient has completed radiation treatment. The patient will return to radiation oncology clinic for routine followup in one month. I advised them to call or return sooner if they have any questions or concerns related to their recovery or treatment.  ------------------------------------------------  Jodelle Gross, MD, PhD  This document serves as a record of services personally performed by Kyung Rudd, MD. It was created on his behalf by Arlyce Harman, a trained medical scribe. The creation of this record is based on the scribe's personal observations and the provider's statements to them. This document has been checked and approved by the attending provider.

## 2017-01-22 NOTE — Telephone Encounter (Signed)
Ok for prolia, dr Dellis Filbert for backup

## 2017-01-22 NOTE — Telephone Encounter (Signed)
Has completed Radiation TX released to have Prolia injection per pt. PC to schedule LM, Will need to ask Elon Alas about MD provider

## 2017-01-22 NOTE — Telephone Encounter (Signed)
PC to pt coming in 01/24/17 for Prolia injection at 9 30 Nurse only/

## 2017-01-24 ENCOUNTER — Ambulatory Visit (INDEPENDENT_AMBULATORY_CARE_PROVIDER_SITE_OTHER): Payer: Medicare Other | Admitting: Gynecology

## 2017-01-24 DIAGNOSIS — M810 Age-related osteoporosis without current pathological fracture: Secondary | ICD-10-CM

## 2017-01-24 MED ORDER — DENOSUMAB 60 MG/ML ~~LOC~~ SOLN
60.0000 mg | Freq: Once | SUBCUTANEOUS | Status: AC
  Administered 2017-01-24: 60 mg via SUBCUTANEOUS

## 2017-01-29 NOTE — Telephone Encounter (Signed)
Prolia given 01/24/17  Next due after 07/26/17

## 2017-01-31 ENCOUNTER — Encounter: Payer: Self-pay | Admitting: Anesthesiology

## 2017-02-21 ENCOUNTER — Ambulatory Visit: Payer: Self-pay | Admitting: Radiation Oncology

## 2017-02-22 ENCOUNTER — Ambulatory Visit
Admission: RE | Admit: 2017-02-22 | Discharge: 2017-02-22 | Disposition: A | Discharge status: Home / Self Care | Payer: Medicare Other | Source: Ambulatory Visit | Attending: Radiation Oncology | Admitting: Radiation Oncology

## 2017-02-22 VITALS — BP 104/51 | HR 73 | Temp 98.9°F | Resp 16 | Ht 62.0 in | Wt 115.8 lb

## 2017-02-22 DIAGNOSIS — Z79899 Other long term (current) drug therapy: Secondary | ICD-10-CM | POA: Diagnosis not present

## 2017-02-22 DIAGNOSIS — Z170 Estrogen receptor positive status [ER+]: Secondary | ICD-10-CM | POA: Insufficient documentation

## 2017-02-22 DIAGNOSIS — Y838 Other surgical procedures as the cause of abnormal reaction of the patient, or of later complication, without mention of misadventure at the time of the procedure: Secondary | ICD-10-CM | POA: Diagnosis not present

## 2017-02-22 DIAGNOSIS — M96843 Postprocedural seroma of a musculoskeletal structure following other procedure: Secondary | ICD-10-CM | POA: Diagnosis not present

## 2017-02-22 DIAGNOSIS — Z7982 Long term (current) use of aspirin: Secondary | ICD-10-CM | POA: Insufficient documentation

## 2017-02-22 DIAGNOSIS — C50411 Malignant neoplasm of upper-outer quadrant of right female breast: Secondary | ICD-10-CM | POA: Diagnosis not present

## 2017-02-22 DIAGNOSIS — Z923 Personal history of irradiation: Secondary | ICD-10-CM | POA: Insufficient documentation

## 2017-02-22 DIAGNOSIS — Z79811 Long term (current) use of aromatase inhibitors: Secondary | ICD-10-CM | POA: Diagnosis not present

## 2017-02-22 NOTE — Progress Notes (Signed)
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     Interval Since Last Radiation:  6 weeks   12/13/2016 to 01/10/2017:  1. The Right breast was treated to 42.5 Gy in 17 fractions at 2.5 Gy per fraction. 2. The Right breast was boosted to 7.5 Gy in 3 fractions at 2.5 Gy per fraction.  Narrative:  The patient returns today for routine follow-up. During treatment she did very well with radiotherapy and did not have significant desquamation.                             On review of systems, the patient states she is doing well overall.  She denies any concerns with skin breakdown.  She has had several moles that she is noticed in the breast since radiation.  She denies any concerns with hot flashes or night sweats but has had a body experience when sitting in a car and is not sure if this has anything to do with her aromatase inhibitor.  She is also noticed some change in the dimension of the breast and feels that she may have some new fluid accumulating deep to her incision line.  She is not having any fevers or chills.  No other complaints are verbalized.  ALLERGIES:  is allergic to other.  Meds: Current Outpatient Prescriptions  Medication Sig Dispense Refill  . anastrozole (ARIMIDEX) 1 MG tablet Take 1 tablet (1 mg total) by mouth daily. 90 tablet 3  . aspirin 81 MG tablet Take 81 mg by mouth daily.      . Biotin (PA BIOTIN) 1000 MCG tablet Take 5,000 mcg by mouth daily.     . calcium citrate-vitamin D (CITRACAL+D) 315-200 MG-UNIT per tablet Take 1 tablet by mouth 2 (two) times daily.    . Cyanocobalamin (VITAMIN B-12 PO) Take 1 tablet by mouth daily.    Marland Kitchen esomeprazole (NEXIUM) 20 MG  capsule Take 20 mg by mouth daily at 12 noon.    . fish oil-omega-3 fatty acids 1000 MG capsule Take 2 g by mouth daily.      . Flaxseed, Linseed, (FLAX SEEDS PO) Take 1 tablet by mouth daily.     . pravastatin (PRAVACHOL) 10 MG tablet Take 10 mg by mouth daily.      . valACYclovir (VALTREX) 1000 MG tablet Take 1 tablet by mouth as needed.   0  . ibuprofen (ADVIL,MOTRIN) 800 MG tablet Take 1 tablet (800 mg total) by mouth every 8 (eight) hours as needed. (Patient not taking: Reported on 11/21/2016) 30 tablet 0   No current facility-administered medications for this encounter.     Physical Findings:  height is 5\' 2"  (1.575 m) and weight is 115 lb 12.8 oz (52.5 kg). Her oral temperature is 98.9 F (37.2 C). Her blood pressure is 104/51 (abnormal) and her pulse is 73. Her respiration is 16 and oxygen saturation is 100%.  Pain Assessment Pain Score: 0-No pain/10 In general this is a well appearing caucasian female in no acute distress. She's alert and oriented x4 and appropriate throughout the examination. Cardiopulmonary assessment is negative for acute distress and  she exhibits normal effort. The right breast was examined and reveals mild distortion from fluid accumulation in the lumpectomy cavity.  The size of fluctuance overall is approximately 4.5 cm.  Skin does not reveal any cellulitic appearing changes.  No separation or breakdown with the incision line is noted.   Lab Findings: Lab Results  Component Value Date   WBC 4.8 09/08/2016   HGB 13.8 09/08/2016   HCT 42.4 09/08/2016   MCV 96.4 09/08/2016   PLT 332 09/08/2016     Radiographic Findings: No results found.  Impression/Plan: 1. 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 The patient has been doing well since completion of radiotherapy. We discussed that we would be happy to continue to follow her as needed, but she will also continue to follow up with Dr.  Lindi Adie in medical oncology. She was counseled on skin care as well as measures to avoid sun exposure to this area.  2. Survivorship. The patient will follow up in survivorship clinic in December and we discussed the role of this visit. 3. Postop Seroma.  The patient is counseled to discuss this further with Dr. Brantley Stage as she had to have this drained 4 times prior to beginning radiotherapy.  She is in agreement and will contact his office.  I will send this note is well to him.    Carola Rhine, PAC

## 2017-04-04 ENCOUNTER — Telehealth: Payer: Self-pay

## 2017-04-04 NOTE — Telephone Encounter (Signed)
Left message for pt of upcoming SCP appt on 12/20 at 1 pm.

## 2017-04-12 ENCOUNTER — Telehealth: Payer: Self-pay | Admitting: Adult Health

## 2017-04-12 ENCOUNTER — Ambulatory Visit: Payer: Medicare Other | Admitting: Adult Health

## 2017-04-12 ENCOUNTER — Encounter: Payer: Self-pay | Admitting: Adult Health

## 2017-04-12 VITALS — BP 115/64 | HR 75 | Temp 98.8°F | Resp 18 | Ht 62.0 in | Wt 116.2 lb

## 2017-04-12 DIAGNOSIS — C50411 Malignant neoplasm of upper-outer quadrant of right female breast: Secondary | ICD-10-CM | POA: Diagnosis not present

## 2017-04-12 DIAGNOSIS — Z79811 Long term (current) use of aromatase inhibitors: Secondary | ICD-10-CM | POA: Diagnosis not present

## 2017-04-12 DIAGNOSIS — N951 Menopausal and female climacteric states: Secondary | ICD-10-CM

## 2017-04-12 DIAGNOSIS — M810 Age-related osteoporosis without current pathological fracture: Secondary | ICD-10-CM | POA: Diagnosis not present

## 2017-04-12 DIAGNOSIS — Z170 Estrogen receptor positive status [ER+]: Secondary | ICD-10-CM | POA: Diagnosis not present

## 2017-04-12 NOTE — Progress Notes (Signed)
CLINIC:  Survivorship   REASON FOR VISIT:  Routine follow-up post-treatment for a recent history of breast cancer.  BRIEF ONCOLOGIC HISTORY:    Malignant neoplasm of upper-outer quadrant of right breast in female, estrogen receptor positive ([place])   08/09/2016 Initial Diagnosis    Right breast mass 11:00 position: 1.7 x 1.2 x 1.4 cm, no axillary lymph nodes; biopsy revealed invasive ductal carcinoma ER 100%, PR 100%, Ki-67 10%, HER-2 negative ratio 1.27, grade 1-2, T1c N0 stage IA clinical stage      09/18/2016 Surgery    Rt Lumpectomy: IDC grade 2, 1.7 cm, 0/1 LN Neg, Er 100%, PR 100%, Her 2 Neg, T1CN0 Stage 1A      10/09/2016 Oncotype testing    Oncotype score 10; risk of recurrence 7%      12/13/2016 - 01/10/2017 Radiation Therapy    Adjuvant radiation      12/2016 -  Anti-estrogen oral therapy    Anastrozole daily x 5 years       INTERVAL HISTORY:  Ms. Eye presents to the [facility] today for our initial meeting to review her survivorship care plan detailing her treatment course for breast cancer, as well as monitoring long-term side effects of that treatment, education regarding health maintenance, screening, and overall wellness and health promotion.     Diamond tells me that she is not in a good mood as I walked in the exam room.  She explained that her husband died two years ago, and this season is challenging for her every year.  She is tearful.  She is taking Anastrozole daily and is tolerating it well.  She does have some hot flashes, and is already receiving prolia for osteoporosis.  She denies any other issues today.      REVIEW OF SYSTEMS:  Review of Systems  Constitutional: Negative for appetite change, chills, fatigue, fever and unexpected weight change.  HENT:   Negative for hearing loss, lump/mass, sore throat and trouble swallowing.   Eyes: Negative for eye problems and icterus.  Respiratory: Negative for chest tightness, cough and shortness of  breath.   Cardiovascular: Negative for chest pain, leg swelling and palpitations.  Gastrointestinal: Negative for abdominal distention, abdominal pain, blood in stool, constipation, diarrhea and nausea.  Endocrine: Positive for hot flashes.  Musculoskeletal: Negative for arthralgias.  Skin: Negative for itching and rash.  Neurological: Negative for dizziness, extremity weakness, headaches and numbness.  Hematological: Negative for adenopathy. Does not bruise/bleed easily.  Psychiatric/Behavioral: Negative for depression. The patient is not nervous/anxious.   Breast: Denies any new nodularity, masses, tenderness, nipple changes, or nipple discharge.      ONCOLOGY TREATMENT TEAM:  1. Surgeon:  Dr. Brantley Stage at [facility] 2. Medical Oncologist: Dr. Lindi Adie  3. Radiation Oncologist: Dr. Isidore Moos    PAST MEDICAL/SURGICAL HISTORY:  Past Medical History:  Diagnosis Date  . Arthritis    hands  . Cancer (HCC)    SQUAMOS CELL , LEFT LEG  . Complication of anesthesia   . Headache   . Heart burn   . High cholesterol   . PONV (postoperative nausea and vomiting)   . Pre-diabetes    Past Surgical History:  Procedure Laterality Date  . INNER EAR SURGERY     LEFT..   . RADIOACTIVE SEED GUIDED PARTIAL MASTECTOMY WITH AXILLARY SENTINEL LYMPH NODE BIOPSY Right 09/14/2016   Procedure: RADIOACTIVE SEED GUIDED PARTIAL MASTECTOMY WITH AXILLARY SENTINEL LYMPH NODE BIOPSY;  Surgeon: Erroll Luna, MD;  Location: [facility];  Service: General;  Laterality: Right;  . SKIN CANCER EXCISION  2010   LEFT LEG, SQUAMOS CELL,  . TONSILLECTOMY AND ADENOIDECTOMY    . TUBAL LIGATION    . VEIN SURGERY       ALLERGIES:  Allergies  Allergen Reactions  . Other Rash    LATEX     CURRENT MEDICATIONS:  Outpatient Encounter Medications as of 04/12/2017  Medication Sig Note  . anastrozole (ARIMIDEX) 1 MG tablet Take 1 tablet (1 mg total) by mouth daily.   Marland Kitchen aspirin 81 MG tablet  Take 81 mg by mouth daily.     . Biotin (PA BIOTIN) 1000 MCG tablet Take 5,000 mcg by mouth daily.    . calcium citrate-vitamin D (CITRACAL+D) 315-200 MG-UNIT per tablet Take 1 tablet by mouth 2 (two) times daily.   . Cyanocobalamin (VITAMIN B-12 PO) Take 1 tablet by mouth daily. 09/06/2016: '500mg'$  daily  . esomeprazole (NEXIUM) 20 MG capsule Take 20 mg by mouth daily at 12 noon.   . fish oil-omega-3 fatty acids 1000 MG capsule Take 2 g by mouth daily.     . Flaxseed, Linseed, (FLAX SEEDS PO) Take 1 tablet by mouth daily.  09/06/2016: '1000mg'$    . ibuprofen (ADVIL,MOTRIN) 800 MG tablet Take 1 tablet (800 mg total) by mouth every 8 (eight) hours as needed. (Patient not taking: Reported on 11/21/2016)   . pravastatin (PRAVACHOL) 10 MG tablet Take 10 mg by mouth daily.   09/06/2016: On hold  . valACYclovir (VALTREX) 1000 MG tablet Take 1 tablet by mouth as needed.  04/02/2014: Received from: External Pharmacy Received Sig:    No facility-administered encounter medications on file as of 04/12/2017.      ONCOLOGIC FAMILY HISTORY:  Family History  Problem Relation Age of Onset  . Cancer Father        ORAL   . Cancer Son 64       MELANOMA  . Breast cancer Neg Hx      GENETIC COUNSELING/TESTING: Not indicated,  SOCIAL HISTORY:  Nhyla Nappi is widowed and lives alone, [place].  She has 2 children who live in [place], and [place].  Ms. Alred is currently retired.  She denies any current or history of tobacco, illicit drug use.  She drinks one glass of wine per night.     PHYSICAL EXAMINATION:  Vital Signs:   Vitals:   04/12/17 1317  BP: 115/64  Pulse: 75  Resp: 18  Temp: 98.8 F (37.1 C)  SpO2: 98%   Filed Weights   04/12/17 1317  Weight: 116 lb 3.2 oz (52.7 kg)   General: Well-nourished, well-appearing female in no acute distress.  She is unaccompanied today.   HEENT: Head is normocephalic.  Pupils equal and reactive to light. Conjunctivae clear without exudate.  Sclerae  anicteric. Oral mucosa is pink, moist.  Oropharynx is pink without lesions or erythema.  Lymph: No cervical, supraclavicular, or infraclavicular lymphadenopathy noted on palpation.  Cardiovascular: Regular rate and rhythm.Marland Kitchen Respiratory: Clear to auscultation bilaterally. Chest expansion symmetric; breathing non-labored.  Breast: right breast s/p lumpectomy with mod scar tissue present, + tenderness, no skin changes or thickening noted.  Left breast without nodules, masses, skin or nipple changes.   GI: Abdomen soft and round; non-tender, non-distended. Bowel sounds normoactive.  GU: Deferred.  Neuro: No focal deficits. Steady gait.  Psych: Mood and affect normal and appropriate for situation.  Extremities: No edema. MSK: No focal spinal tenderness to palpation.  Full range  of motion in bilateral upper extremities Skin: Warm and dry.  LABORATORY DATA:  None for this visit.  DIAGNOSTIC IMAGING:  None for this visit.      ASSESSMENT AND PLAN:  Ms.. Menard is a pleasant 76 y.o. female with Stage IA right breast invasive ductal carcinoma, ER+/PR+/HER2-, diagnosed in 07/2016, treated with lumpectomy, adjuvant radiation therapy, and anti-estrogen therapy with Anastrozole beginning in 12/2016.  She presents to the Survivorship Clinic for our initial meeting and routine follow-up post-completion of treatment for breast cancer.    1. Stage IA right breast cancer:  Ms. Shamoon is continuing to recover from definitive treatment for breast cancer. She will follow-up with her medical oncologist, Dr. Lindi Adie in July, 2019 with history and physical exam per surveillance protocol.  She will continue her anti-estrogen therapy with Anastrozole. Thus far, she is tolerating the Anastrozole well, with minimal side effects.  Today, a comprehensive survivorship care plan and treatment summary was reviewed with the patient today detailing her breast cancer diagnosis, treatment course, potential late/long-term effects of  treatment, appropriate follow-up care with recommendations for the future, and patient education resources.  A copy of this summary, along with a letter will be sent to the patient's primary care provider via mail/fax/In Basket message after today's visit.    2. Hot flashes: She is managing these moderately well.  I reviewed non pharmacologic interventions with her today.  She knows to let us know if these become troublesome.    3. Bone health:  Given Ms. Considine's age/history of breast cancer and her current treatment regimen including anti-estrogen therapy with Anastrozole, she is at risk for bone demineralization.  Her last DEXA scan was was in 04/2015 and demonstrated a t score of -2.5.  She receives Prolia twice per year.  She is toelrating this well.  Her gyn follows this and administers the prolia, so we will of course defer to them on management of osteoporosis.  She was given education on specific activities to promote bone health.  4. Cancer screening:  Due to Ms. Lauver's history and her age, she should receive screening for skin cancers, colon cancer, and gynecologic cancers.  The information and recommendations are listed on the patient's comprehensive care plan/treatment summary and were reviewed in detail with the patient.    5. Health maintenance and wellness promotion: Ms. Sturgeon was encouraged to consume 5-7 servings of fruits and vegetables per day. We reviewed the "Nutrition Rainbow" handout, as well as the handout "Take Control of Your Health and Reduce Your Cancer Risk" from the [employer].  She was also encouraged to engage in moderate to vigorous exercise for 30 minutes per day most days of the week. We discussed the [employer] fitness program, which is designed for cancer survivors to help them become more physically fit after cancer treatments.  She was instructed to limit her alcohol consumption and continue to abstain from tobacco use.     6. Support  services/counseling: It is not uncommon for this period of the patient's cancer care trajectory to be one of many emotions and stressors.  We discussed an opportunity for her to participate in the next session of [facility] ("Finding Your New Normal") support group series designed for patients after they have completed treatment.   Ms. Nerio was encouraged to take advantage of our many other support services programs, support groups, and/or counseling in coping with her new life as a cancer survivor after completing anti-cancer treatment.  She was offered support today  through active listening and expressive supportive counseling.  She was given information regarding our available services and encouraged to contact me with any questions or for help enrolling in any of our support group/programs.    Dispo:   -Return to cancer center for follow up in 6 months with Dr. Lindi Adie -Mammogram due in 06/2017 -Bone density in 04/2017 per GYN -Follow up with Dr. Brantley Stage in 1-2 months -She is welcome to return back to the Survivorship Clinic at any time; no additional follow-up needed at this time.  -Consider referral back to survivorship as a long-term survivor for continued surveillance  A total of (30) minutes of face-to-face time was spent with this patient with greater than 50% of that time in counseling and care-coordination.   Gardenia Phlegm, [place] [phone]   Note: PRIMARY CARE PROVIDER Nicola Girt, [place] [phone]

## 2017-04-12 NOTE — Telephone Encounter (Signed)
Gave patient AVs and calendar of upcoming July 2019 appointments.

## 2017-06-08 ENCOUNTER — Telehealth: Payer: Self-pay | Admitting: *Deleted

## 2017-06-08 NOTE — Telephone Encounter (Signed)
Prolia insurance verification has been sent awaiting Summary of benefits  

## 2017-06-12 NOTE — Telephone Encounter (Signed)
Deductible Amount MET  OOP MAX $4400  Annual exam scheduled 06/28/17  Calcium 9.3          Date 09/08/16  Upcoming dental procedures NO  Prior Authorization needed NO  Pt estimated Cost $263    Appt 07/26/17 at 11:00   Coverage Details: 20% of one dose, $50 admin fee

## 2017-06-28 ENCOUNTER — Encounter: Payer: Medicare Other | Admitting: Obstetrics & Gynecology

## 2017-07-12 ENCOUNTER — Ambulatory Visit (INDEPENDENT_AMBULATORY_CARE_PROVIDER_SITE_OTHER): Payer: Medicare Other | Admitting: Ophthalmology

## 2017-07-17 ENCOUNTER — Ambulatory Visit
Admission: RE | Admit: 2017-07-17 | Discharge: 2017-07-17 | Disposition: A | Discharge status: Home / Self Care | Payer: Medicare Other | Source: Ambulatory Visit | Attending: Adult Health | Admitting: Adult Health

## 2017-07-17 DIAGNOSIS — C50411 Malignant neoplasm of upper-outer quadrant of right female breast: Secondary | ICD-10-CM

## 2017-07-17 DIAGNOSIS — Z170 Estrogen receptor positive status [ER+]: Principal | ICD-10-CM

## 2017-07-17 HISTORY — DX: Malignant neoplasm of unspecified site of unspecified female breast: C50.919

## 2017-07-17 HISTORY — DX: Personal history of irradiation: Z92.3

## 2017-07-26 ENCOUNTER — Ambulatory Visit (INDEPENDENT_AMBULATORY_CARE_PROVIDER_SITE_OTHER): Payer: Medicare Other | Admitting: Ophthalmology

## 2017-07-26 ENCOUNTER — Ambulatory Visit: Payer: Medicare Other

## 2017-07-30 NOTE — Telephone Encounter (Signed)
Pt changed date to receive prolia  Having same day as appt 07/31/17

## 2017-07-31 ENCOUNTER — Ambulatory Visit: Payer: Medicare Other | Admitting: Women's Health

## 2017-07-31 ENCOUNTER — Encounter: Payer: Self-pay | Admitting: Women's Health

## 2017-07-31 VITALS — BP 118/80 | Ht 62.0 in | Wt 116.0 lb

## 2017-07-31 DIAGNOSIS — M810 Age-related osteoporosis without current pathological fracture: Secondary | ICD-10-CM | POA: Diagnosis not present

## 2017-07-31 DIAGNOSIS — Z01419 Encounter for gynecological examination (general) (routine) without abnormal findings: Secondary | ICD-10-CM | POA: Diagnosis not present

## 2017-07-31 MED ORDER — DENOSUMAB 60 MG/ML ~~LOC~~ SOLN
60.0000 mg | Freq: Once | SUBCUTANEOUS | Status: AC
  Administered 2017-07-31: 60 mg via SUBCUTANEOUS

## 2017-07-31 NOTE — Patient Instructions (Signed)
BRCA testing  Genetic testing for breast cancer   Health Maintenance for Postmenopausal Women Menopause is a normal process in which your reproductive ability comes to an end. This process happens gradually over a span of months to years, usually between the ages of 42 and 48. Menopause is complete when you have missed 12 consecutive menstrual periods. It is important to talk with your health care provider about some of the most common conditions that affect postmenopausal women, such as heart disease, cancer, and bone loss (osteoporosis). Adopting a healthy lifestyle and getting preventive care can help to promote your health and wellness. Those actions can also lower your chances of developing some of these common conditions. What should I know about menopause? During menopause, you may experience a number of symptoms, such as:  Moderate-to-severe hot flashes.  Night sweats.  Decrease in sex drive.  Mood swings.  Headaches.  Tiredness.  Irritability.  Memory problems.  Insomnia.  Choosing to treat or not to treat menopausal changes is an individual decision that you make with your health care provider. What should I know about hormone replacement therapy and supplements? Hormone therapy products are effective for treating symptoms that are associated with menopause, such as hot flashes and night sweats. Hormone replacement carries certain risks, especially as you become older. If you are thinking about using estrogen or estrogen with progestin treatments, discuss the benefits and risks with your health care provider. What should I know about heart disease and stroke? Heart disease, heart attack, and stroke become more likely as you age. This may be due, in part, to the hormonal changes that your body experiences during menopause. These can affect how your body processes dietary fats, triglycerides, and cholesterol. Heart attack and stroke are both medical emergencies. There are many  things that you can do to help prevent heart disease and stroke:  Have your blood pressure checked at least every 1-2 years. High blood pressure causes heart disease and increases the risk of stroke.  If you are 57-36 years old, ask your health care provider if you should take aspirin to prevent a heart attack or a stroke.  Do not use any tobacco products, including cigarettes, chewing tobacco, or electronic cigarettes. If you need help quitting, ask your health care provider.  It is important to eat a healthy diet and maintain a healthy weight. ? Be sure to include plenty of vegetables, fruits, low-fat dairy products, and lean protein. ? Avoid eating foods that are high in solid fats, added sugars, or salt (sodium).  Get regular exercise. This is one of the most important things that you can do for your health. ? Try to exercise for at least 150 minutes each week. The type of exercise that you do should increase your heart rate and make you sweat. This is known as moderate-intensity exercise. ? Try to do strengthening exercises at least twice each week. Do these in addition to the moderate-intensity exercise.  Know your numbers.Ask your health care provider to check your cholesterol and your blood glucose. Continue to have your blood tested as directed by your health care provider.  What should I know about cancer screening? There are several types of cancer. Take the following steps to reduce your risk and to catch any cancer development as early as possible. Breast Cancer  Practice breast self-awareness. ? This means understanding how your breasts normally appear and feel. ? It also means doing regular breast self-exams. Let your health care provider know about any  changes, no matter how small.  If you are 43 or older, have a clinician do a breast exam (clinical breast exam or CBE) every year. Depending on your age, family history, and medical history, it may be recommended that you  also have a yearly breast X-ray (mammogram).  If you have a family history of breast cancer, talk with your health care provider about genetic screening.  If you are at high risk for breast cancer, talk with your health care provider about having an MRI and a mammogram every year.  Breast cancer (BRCA) gene test is recommended for women who have family members with BRCA-related cancers. Results of the assessment will determine the need for genetic counseling and BRCA1 and for BRCA2 testing. BRCA-related cancers include these types: ? Breast. This occurs in males or females. ? Ovarian. ? Tubal. This may also be called fallopian tube cancer. ? Cancer of the abdominal or pelvic lining (peritoneal cancer). ? Prostate. ? Pancreatic.  Cervical, Uterine, and Ovarian Cancer Your health care provider may recommend that you be screened regularly for cancer of the pelvic organs. These include your ovaries, uterus, and vagina. This screening involves a pelvic exam, which includes checking for microscopic changes to the surface of your cervix (Pap test).  For women ages 21-65, health care providers may recommend a pelvic exam and a Pap test every three years. For women ages 32-65, they may recommend the Pap test and pelvic exam, combined with testing for human papilloma virus (HPV), every five years. Some types of HPV increase your risk of cervical cancer. Testing for HPV may also be done on women of any age who have unclear Pap test results.  Other health care providers may not recommend any screening for nonpregnant women who are considered low risk for pelvic cancer and have no symptoms. Ask your health care provider if a screening pelvic exam is right for you.  If you have had past treatment for cervical cancer or a condition that could lead to cancer, you need Pap tests and screening for cancer for at least 20 years after your treatment. If Pap tests have been discontinued for you, your risk factors  (such as having a new sexual partner) need to be reassessed to determine if you should start having screenings again. Some women have medical problems that increase the chance of getting cervical cancer. In these cases, your health care provider may recommend that you have screening and Pap tests more often.  If you have a family history of uterine cancer or ovarian cancer, talk with your health care provider about genetic screening.  If you have vaginal bleeding after reaching menopause, tell your health care provider.  There are currently no reliable tests available to screen for ovarian cancer.  Lung Cancer Lung cancer screening is recommended for adults 39-45 years old who are at high risk for lung cancer because of a history of smoking. A yearly low-dose CT scan of the lungs is recommended if you:  Currently smoke.  Have a history of at least 30 pack-years of smoking and you currently smoke or have quit within the past 15 years. A pack-year is smoking an average of one pack of cigarettes per day for one year.  Yearly screening should:  Continue until it has been 15 years since you quit.  Stop if you develop a health problem that would prevent you from having lung cancer treatment.  Colorectal Cancer  This type of cancer can be detected and can often  be prevented.  Routine colorectal cancer screening usually begins at age 55 and continues through age 23.  If you have risk factors for colon cancer, your health care provider may recommend that you be screened at an earlier age.  If you have a family history of colorectal cancer, talk with your health care provider about genetic screening.  Your health care provider may also recommend using home test kits to check for hidden blood in your stool.  A small camera at the end of a tube can be used to examine your colon directly (sigmoidoscopy or colonoscopy). This is done to check for the earliest forms of colorectal cancer.  Direct  examination of the colon should be repeated every 5-10 years until age 82. However, if early forms of precancerous polyps or small growths are found or if you have a family history or genetic risk for colorectal cancer, you may need to be screened more often.  Skin Cancer  Check your skin from head to toe regularly.  Monitor any moles. Be sure to tell your health care provider: ? About any new moles or changes in moles, especially if there is a change in a mole's shape or color. ? If you have a mole that is larger than the size of a pencil eraser.  If any of your family members has a history of skin cancer, especially at a Jaquavis Felmlee age, talk with your health care provider about genetic screening.  Always use sunscreen. Apply sunscreen liberally and repeatedly throughout the day.  Whenever you are outside, protect yourself by wearing long sleeves, pants, a wide-brimmed hat, and sunglasses.  What should I know about osteoporosis? Osteoporosis is a condition in which bone destruction happens more quickly than new bone creation. After menopause, you may be at an increased risk for osteoporosis. To help prevent osteoporosis or the bone fractures that can happen because of osteoporosis, the following is recommended:  If you are 53-79 years old, get at least 1,000 mg of calcium and at least 600 mg of vitamin D per day.  If you are older than age 60 but younger than age 20, get at least 1,200 mg of calcium and at least 600 mg of vitamin D per day.  If you are older than age 25, get at least 1,200 mg of calcium and at least 800 mg of vitamin D per day.  Smoking and excessive alcohol intake increase the risk of osteoporosis. Eat foods that are rich in calcium and vitamin D, and do weight-bearing exercises several times each week as directed by your health care provider. What should I know about how menopause affects my mental health? Depression may occur at any age, but it is more common as you become  older. Common symptoms of depression include:  Low or sad mood.  Changes in sleep patterns.  Changes in appetite or eating patterns.  Feeling an overall lack of motivation or enjoyment of activities that you previously enjoyed.  Frequent crying spells.  Talk with your health care provider if you think that you are experiencing depression. What should I know about immunizations? It is important that you get and maintain your immunizations. These include:  Tetanus, diphtheria, and pertussis (Tdap) booster vaccine.  Influenza every year before the flu season begins.  Pneumonia vaccine.  Shingles vaccine.  Your health care provider may also recommend other immunizations. This information is not intended to replace advice given to you by your health care provider. Make sure you discuss any questions  you have with your health care provider. Document Released: 06/02/2005 Document Revised: 10/29/2015 Document Reviewed: 01/12/2015 Elsevier Interactive Patient Education  2018 [employer].

## 2017-07-31 NOTE — Telephone Encounter (Signed)
Prolia Given 07/31/17 Next injection 01/31/18

## 2017-07-31 NOTE — Progress Notes (Signed)
1940-07-04 [id number]    History:    Presents for breast and pelvicl exam.  07/2016 right breast cancer lumpectomy and radiation.  ER PR positive HER-2 negative on Arimidex.  Normal Pap history.  Osteoporosis T score -3.6 showed improvement on Prolia since 2015.  Had 1 dose of Reclast while living in [place].  Vaccines current.  Husband died of brain cancer 2-1/2 years ago diagnosed shortly after moving to [place].,  Squamous skin cancer has annual skin checks.  History of a negative colonoscopy.  Postmenopausal on no HRT with no bleeding.  Per cholesteremia primary care manages.  Past medical history, past surgical history, family history and social history were all reviewed and documented in the EPIC chart.  [facility] meeting some friends and doing activities.  Daughter lives in [place], son lives local.  ROS:  A ROS was performed and pertinent positives and negatives are included.  Exam:  Vitals:   07/31/17 1412  BP: 118/80  Weight: 116 lb (52.6 kg)  Height: _0  (1.575 m)   Body mass index is 21.22 kg/m.   General appearance:  Normal Thyroid:  Symmetrical, normal in size, without palpable masses or nodularity. Respiratory  Auscultation:  Clear without wheezing or rhonchi Cardiovascular  Auscultation:  Regular rate, without rubs, murmurs or gallops  Edema/varicosities:  Not grossly evident Abdominal  Soft,nontender, without masses, guarding or rebound.  Liver/spleen:  No organomegaly noted  Hernia:  None appreciated  Skin  Inspection:  Grossly normal   Breasts: Examined lying and sitting.     Right: Without masses, retractions, discharge or axillary adenopathy.     Left: Without masses, retractions, discharge or axillary adenopathy. Gentitourinary   Inguinal/mons:  Normal without inguinal adenopathy  External genitalia:  Normal  BUS/Urethra/Skene's glands:  Normal  Vagina:  Normal  Cervix:  Normal  Uterus:   normal in size, shape and  contour.  Midline and mobile  Adnexa/parametria:     Rt: Without masses or tenderness.   Lt: Without masses or tenderness.  Anus and perineum: Normal  Digital rectal exam: Normal sphincter tone without palpated masses or tenderness  Assessment/Plan:  77 y.o. [employer] F G2 P2 for breast and pelvic exam with no GYN complaints.  Postmenopausal on no HRT with no bleeding 07/2016 right breast cancer lumpectomy/radiation ER PR positive HER-2 negative on Arimidex per oncologist Osteoporosis on Prolia. Hypercholesteremia primary care managing labs and meds  Plan: SBE's, continue annual screening mammogram, calcium rich foods, vitamin D 2000 daily encouraged.  Prolia 60 mg subcu every 6 months.  Schedule DEXA, home safety, fall prevention and importance of continuing regular and balance type exercise.    [place], 3:16 PM 07/31/2017

## 2017-08-02 ENCOUNTER — Encounter (INDEPENDENT_AMBULATORY_CARE_PROVIDER_SITE_OTHER): Payer: Medicare Other | Admitting: Ophthalmology

## 2017-08-02 ENCOUNTER — Encounter: Payer: Self-pay | Admitting: Obstetrics & Gynecology

## 2017-09-20 ENCOUNTER — Encounter (INDEPENDENT_AMBULATORY_CARE_PROVIDER_SITE_OTHER): Payer: Medicare Other | Admitting: Ophthalmology

## 2017-09-20 DIAGNOSIS — H2513 Age-related nuclear cataract, bilateral: Secondary | ICD-10-CM

## 2017-09-20 DIAGNOSIS — D3131 Benign neoplasm of right choroid: Secondary | ICD-10-CM | POA: Diagnosis not present

## 2017-09-20 DIAGNOSIS — H43813 Vitreous degeneration, bilateral: Secondary | ICD-10-CM | POA: Diagnosis not present

## 2017-09-25 ENCOUNTER — Telehealth: Payer: Self-pay | Admitting: Hematology and Oncology

## 2017-09-25 NOTE — Telephone Encounter (Signed)
Spoke w/ pt.  Rescheduled appt from 7/25 to 8/1 @ 10:30.  (VG PAL on 7/25).

## 2017-11-15 ENCOUNTER — Ambulatory Visit: Payer: Medicare Other | Admitting: Hematology and Oncology

## 2017-11-22 ENCOUNTER — Inpatient Hospital Stay: Payer: Medicare Other | Attending: Hematology and Oncology | Admitting: Hematology and Oncology

## 2017-11-22 ENCOUNTER — Telehealth: Payer: Self-pay | Admitting: Hematology and Oncology

## 2017-11-22 DIAGNOSIS — K219 Gastro-esophageal reflux disease without esophagitis: Secondary | ICD-10-CM | POA: Insufficient documentation

## 2017-11-22 DIAGNOSIS — M810 Age-related osteoporosis without current pathological fracture: Secondary | ICD-10-CM

## 2017-11-22 DIAGNOSIS — Z923 Personal history of irradiation: Secondary | ICD-10-CM | POA: Diagnosis not present

## 2017-11-22 DIAGNOSIS — Z79811 Long term (current) use of aromatase inhibitors: Secondary | ICD-10-CM | POA: Insufficient documentation

## 2017-11-22 DIAGNOSIS — C50411 Malignant neoplasm of upper-outer quadrant of right female breast: Secondary | ICD-10-CM | POA: Diagnosis present

## 2017-11-22 DIAGNOSIS — Z170 Estrogen receptor positive status [ER+]: Secondary | ICD-10-CM | POA: Diagnosis not present

## 2017-11-22 HISTORY — DX: Age-related osteoporosis without current pathological fracture: M81.0

## 2017-11-22 MED ORDER — ANASTROZOLE 1 MG PO TABS: 1.0000 mg | ORAL_TABLET | Freq: Every day | ORAL | 3 refills | Status: DC

## 2017-11-22 NOTE — Telephone Encounter (Signed)
Gave avs and calendar patient requested date

## 2017-11-22 NOTE — Assessment & Plan Note (Signed)
09/18/16: Rt Lumpectomy: IDC grade 2, 1.7 cm, 0/1 LN Neg, Er 100%, PR 100%, Her 2 Neg, T1CN0 Stage 1A Oncotype score 10, risk of recurrence 7%, low risk Adjuvant radiation therapy 12/13/2016-01/10/2017  Treatment plan: Adjuvant antiestrogen therapy with anastrozole 1 mg by mouth daily 5 years  Anastrozole toxicities:  Breast cancer surveillance: 1.  Breast exam 04/12/2017: Benign 2. Mammogram 07/17/2017: No evidence of malignancy breast density category C  Return to clinic in 1 year for follow-up

## 2017-11-22 NOTE — Progress Notes (Signed)
Patient Care Team: Nicola Girt, DO as PCP - General (Internal Medicine) Nicola Girt, DO Nicholas Lose, MD as Consulting Physician (Hematology and Oncology) Delice Bison, Charlestine Massed, NP as Nurse Practitioner (Hematology and Oncology) Kyung Rudd, MD as Consulting Physician (Radiation Oncology) Erroll Luna, MD as Consulting Physician (General Surgery)  DIAGNOSIS:  Encounter Diagnosis  Name Primary?  . Malignant neoplasm of upper-outer quadrant of right breast in female, estrogen receptor positive ([place])     SUMMARY OF ONCOLOGIC HISTORY:   Malignant neoplasm of upper-outer quadrant of right breast in female, estrogen receptor positive ([place])   08/09/2016 Initial Diagnosis    Right breast mass 11:00 position: 1.7 x 1.2 x 1.4 cm, no axillary lymph nodes; biopsy revealed invasive ductal carcinoma ER 100%, PR 100%, Ki-67 10%, HER-2 negative ratio 1.27, grade 1-2, T1c N0 stage IA clinical stage      09/18/2016 Surgery    Rt Lumpectomy: IDC grade 2, 1.7 cm, 0/1 LN Neg, Er 100%, PR 100%, Her 2 Neg, T1CN0 Stage 1A      10/09/2016 Oncotype testing    Oncotype score 10; risk of recurrence 7%      12/13/2016 - 01/10/2017 Radiation Therapy    Adjuvant radiation      12/2016 -  Anti-estrogen oral therapy    Anastrozole daily x 5 years       CHIEF COMPLIANT: Follow-up on anastrozole therapy  INTERVAL HISTORY: Crystal Tate is a 77-year with above-mentioned history of right breast cancer who is currently on anastrozole therapy and appears to be tolerating extremely well.  She does not have any hot flashes or myalgias.  She had episodes of acid reflux which are being aggressively treated by her primary care physician.  Denies any lumps or nodules in the breast.  She has a hard sensation underneath the arm since the surgery.  REVIEW OF SYSTEMS:   Constitutional: Denies fevers, chills or abnormal weight loss Eyes: Denies blurriness of vision Ears, nose, mouth, throat, and face:  Denies mucositis or sore throat Respiratory: Denies cough, dyspnea or wheezes Cardiovascular: Denies palpitation, chest discomfort Gastrointestinal:  Denies nausea, heartburn or change in bowel habits Skin: Denies abnormal skin rashes Lymphatics: Denies new lymphadenopathy or easy bruising Neurological:Denies numbness, tingling or new weaknesses Behavioral/Psych: Mood is stable, no new changes  Extremities: No lower extremity edema Breast:  denies any pain or lumps or nodules in either breasts All other systems were reviewed with the patient and are negative.  I have reviewed the past medical history, past surgical history, social history and family history with the patient and they are unchanged from previous note.  ALLERGIES:  is allergic to other.  MEDICATIONS:  Current Outpatient Medications  Medication Sig Dispense Refill  . anastrozole (ARIMIDEX) 1 MG tablet Take 1 tablet (1 mg total) by mouth daily. 90 tablet 3  . aspirin 81 MG tablet Take 81 mg by mouth daily.      . Biotin (PA BIOTIN) 1000 MCG tablet Take 5,000 mcg by mouth daily.     . calcium citrate-vitamin D (CITRACAL+D) 315-200 MG-UNIT per tablet Take 1 tablet by mouth 2 (two) times daily.    . Cyanocobalamin (VITAMIN B-12 PO) Take 1 tablet by mouth daily.    Marland Kitchen esomeprazole (NEXIUM) 20 MG capsule Take 20 mg by mouth daily at 12 noon.    . fish oil-omega-3 fatty acids 1000 MG capsule Take 2 g by mouth daily.      . Flaxseed, Linseed, (FLAX SEEDS PO) Take 1  tablet by mouth daily.     Marland Kitchen ibuprofen (ADVIL,MOTRIN) 800 MG tablet Take 1 tablet (800 mg total) by mouth every 8 (eight) hours as needed. 30 tablet 0  . pravastatin (PRAVACHOL) 10 MG tablet Take 10 mg by mouth daily.      . valACYclovir (VALTREX) 1000 MG tablet Take 1 tablet by mouth as needed.   0   No current facility-administered medications for this visit.     PHYSICAL EXAMINATION: ECOG PERFORMANCE STATUS: 1 - Symptomatic but completely ambulatory  There were  no vitals filed for this visit. There were no vitals filed for this visit.  GENERAL:alert, no distress and comfortable SKIN: skin color, texture, turgor are normal, no rashes or significant lesions EYES: normal, Conjunctiva are pink and non-injected, sclera clear OROPHARYNX:no exudate, no erythema and lips, buccal mucosa, and tongue normal  NECK: supple, thyroid normal size, non-tender, without nodularity LYMPH:  no palpable lymphadenopathy in the cervical, axillary or inguinal LUNGS: clear to auscultation and percussion with normal breathing effort HEART: regular rate & rhythm and no murmurs and no lower extremity edema ABDOMEN:abdomen soft, non-tender and normal bowel sounds MUSCULOSKELETAL:no cyanosis of digits and no clubbing  NEURO: alert & oriented x 3 with fluent speech, no focal motor/sensory deficits EXTREMITIES: No lower extremity edema BREAST: No palpable masses or nodules in either right or left breasts. No palpable axillary supraclavicular or infraclavicular adenopathy no breast tenderness or nipple discharge. (exam performed in the presence of a chaperone)  LABORATORY DATA:  I have reviewed the data as listed CMP Latest Ref Rng & Units 09/08/2016 05/06/2015 10/20/2014  Glucose 65 - 99 mg/dL 110(H) - -  BUN 6 - 20 mg/dL 9 - -  Creatinine 0.44 - 1.00 mg/dL 0.79 - -  Sodium 135 - 145 mmol/L 139 - -  Potassium 3.5 - 5.1 mmol/L 4.5 - -  Chloride 101 - 111 mmol/L 104 - -  CO2 22 - 32 mmol/L 28 - -  Calcium 8.9 - 10.3 mg/dL 9.3 9.4 9.3  Total Protein 6.5 - 8.1 g/dL 6.9 - -  Total Bilirubin 0.3 - 1.2 mg/dL 0.5 - -  Alkaline Phos 38 - 126 U/L 30(L) - -  AST 15 - 41 U/L 20 - -  ALT 14 - 54 U/L 19 - -    Lab Results  Component Value Date   WBC 4.8 09/08/2016   HGB 13.8 09/08/2016   HCT 42.4 09/08/2016   MCV 96.4 09/08/2016   PLT 332 09/08/2016   NEUTROABS 2.5 09/08/2016    ASSESSMENT & PLAN:  Malignant neoplasm of upper-outer quadrant of right breast in female, estrogen  receptor positive ([place]) 09/18/16: Rt Lumpectomy: IDC grade 2, 1.7 cm, 0/1 LN Neg, Er 100%, PR 100%, Her 2 Neg, T1CN0 Stage 1A Oncotype score 10, risk of recurrence 7%, low risk Adjuvant radiation therapy 12/13/2016-01/10/2017  Treatment plan: Adjuvant antiestrogen therapy with anastrozole 1 mg by mouth daily 5 years  Anastrozole toxicities:  Breast cancer surveillance: 1.  Breast exam 04/12/2017: Benign 2. Mammogram 07/17/2017: No evidence of malignancy breast density category C  Return to clinic in 1 year for follow-up    No orders of the defined types were placed in this encounter.  The patient has a good understanding of the overall plan. she agrees with it. she will call with any problems that may develop before the next visit here.   Harriette Ohara, MD 11/22/17

## 2017-12-10 ENCOUNTER — Other Ambulatory Visit: Payer: Self-pay | Admitting: Gynecology

## 2017-12-10 DIAGNOSIS — M810 Age-related osteoporosis without current pathological fracture: Secondary | ICD-10-CM

## 2017-12-20 ENCOUNTER — Encounter: Payer: Self-pay | Admitting: Gynecology

## 2017-12-20 ENCOUNTER — Ambulatory Visit (INDEPENDENT_AMBULATORY_CARE_PROVIDER_SITE_OTHER): Payer: Medicare Other

## 2017-12-20 DIAGNOSIS — M810 Age-related osteoporosis without current pathological fracture: Secondary | ICD-10-CM | POA: Diagnosis not present

## 2018-01-04 ENCOUNTER — Telehealth: Payer: Self-pay | Admitting: *Deleted

## 2018-01-04 NOTE — Telephone Encounter (Signed)
T score is -3.5.  It was -3.6 in 2017 and -4.1 in 2014.  So overall it is better/stable.

## 2018-01-04 NOTE — Telephone Encounter (Signed)
Patient received letter in mail regarding bone density her only question is what is her T-Score?

## 2018-01-04 NOTE — Telephone Encounter (Signed)
Left on voicemail the below.

## 2018-01-10 ENCOUNTER — Telehealth: Payer: Self-pay | Admitting: *Deleted

## 2018-01-10 NOTE — Telephone Encounter (Addendum)
Deductible Amount Met  OOP MAX $4400 (749mt)  Annual exam 07/31/17 ML  Calcium 10.1  @ [facility]   Date 06/30/17  Upcoming dental procedures NO  Prior Authorization needed NO  Pt estimated Cost $243   Appt 02/12/18 @ 9:30    Coverage Details 20% of one dose,$30 admin fee

## 2018-02-12 ENCOUNTER — Ambulatory Visit (INDEPENDENT_AMBULATORY_CARE_PROVIDER_SITE_OTHER): Payer: Medicare Other | Admitting: Gynecology

## 2018-02-12 DIAGNOSIS — M810 Age-related osteoporosis without current pathological fracture: Secondary | ICD-10-CM | POA: Diagnosis not present

## 2018-02-12 MED ORDER — DENOSUMAB 60 MG/ML ~~LOC~~ SOSY
60.0000 mg | PREFILLED_SYRINGE | Freq: Once | SUBCUTANEOUS | Status: AC
  Administered 2018-02-12: 60 mg via SUBCUTANEOUS

## 2018-02-18 NOTE — Telephone Encounter (Signed)
PROLIA GIVEN 02/12/18 NEXT INJECTION 08/15/2018

## 2018-05-08 ENCOUNTER — Encounter: Payer: Self-pay | Admitting: Anesthesiology

## 2018-06-21 ENCOUNTER — Telehealth: Payer: Self-pay | Admitting: *Deleted

## 2018-06-21 DIAGNOSIS — Z853 Personal history of malignant neoplasm of breast: Secondary | ICD-10-CM

## 2018-06-21 NOTE — Telephone Encounter (Signed)
Patient called stating breast center needs orders for diag. Mammogram per last mammogram on 07/17/17 "Bilateral diagnostic mammogram in 1 year.  Orders placed patient will call to schedule.

## 2018-07-17 ENCOUNTER — Telehealth: Payer: Self-pay | Admitting: *Deleted

## 2018-07-17 NOTE — Telephone Encounter (Signed)
Deductible Amount met  OOP MAX $3600(18mt)  Annual exam NEEDS ANNUAL EXAM APPT 08/20/18  Calcium             Date SAME DAY AS ANNUAL  Upcoming dental procedures   Prior Authorization needed   Pt estimated Cost $401      Coverage Details: 20% one dose,20% admin fee

## 2018-08-01 NOTE — Telephone Encounter (Signed)
Patient coming for prolia injection on 08/15/18 @ 8:30am co-pay amount $ 247.45

## 2018-08-15 ENCOUNTER — Ambulatory Visit: Payer: Self-pay

## 2018-08-15 NOTE — Telephone Encounter (Signed)
Pt moved injection date to 08/28/18 [facility] CMA

## 2018-08-20 ENCOUNTER — Encounter: Payer: Medicare Other | Admitting: Women's Health

## 2018-08-27 ENCOUNTER — Other Ambulatory Visit: Payer: Self-pay

## 2018-08-28 ENCOUNTER — Encounter: Payer: Self-pay | Admitting: Women's Health

## 2018-08-28 ENCOUNTER — Ambulatory Visit: Payer: Medicare Other | Admitting: Women's Health

## 2018-08-28 VITALS — BP 122/80 | Ht 62.0 in | Wt 114.0 lb

## 2018-08-28 DIAGNOSIS — Z01419 Encounter for gynecological examination (general) (routine) without abnormal findings: Secondary | ICD-10-CM | POA: Diagnosis not present

## 2018-08-28 DIAGNOSIS — M810 Age-related osteoporosis without current pathological fracture: Secondary | ICD-10-CM

## 2018-08-28 MED ORDER — DENOSUMAB 60 MG/ML ~~LOC~~ SOSY
60.0000 mg | PREFILLED_SYRINGE | Freq: Once | SUBCUTANEOUS | Status: AC
  Administered 2018-08-28: 60 mg via SUBCUTANEOUS

## 2018-08-28 NOTE — Addendum Note (Signed)
Addended by: Lorine Bears on: 08/28/2018 08:56 AM   Modules accepted: Orders

## 2018-08-28 NOTE — Progress Notes (Signed)
Crystal Tate Aug 06, 1940 [id number]    History:    Presents for breast / pelvic exam and Prolia injection.  Postmenopausal on no HRT with no bleeding.  Not sexually active.   07/2016 right breast cancer lumpectomy and radiation.  Osteoporosis 11/2017 T score -3.5 at spine stable hip -0.9 on Prolia tolerating well.  Has had pneumonia vaccine, and had a chickenpox vaccine 5 years ago in [place].  2013-08-09- colonoscopy.  Primary care manages hypercholesteremia.  Squamous skin cancer has annual skin check.  Past medical history, past surgical history, family history and social history were all reviewed and documented in the EPIC chart.  From [place], husband diagnoses shortly after moving here and deceased from brain cancer 08-10-2015.  Reports managing living on her own, able to maintain yard and home.  Son lives local, daughter lives in [place].  [facility] currently doing [facility] and walking daily.  ROS:  A ROS was performed and pertinent positives and negatives are included.  Exam:  Vitals:   08/28/18 0807  BP: 122/80  Weight: 114 lb (51.7 kg)  Height: 5\' 2"  (1.575 m)   Body mass index is 20.85 kg/m.   General appearance:  Normal Thyroid:  Symmetrical, normal in size, without palpable masses or nodularity. Respiratory  Auscultation:  Clear without wheezing or rhonchi Cardiovascular  Auscultation:  Regular rate, without rubs, murmurs or gallops  Edema/varicosities:  Not grossly evident Abdominal  Soft,nontender, without masses, guarding or rebound.  Liver/spleen:  No organomegaly noted  Hernia:  None appreciated  Skin  Inspection:  Grossly normal   Breasts: Examined lying and sitting.     Right: Without masses, retractions, discharge or axillary adenopathy.     Left: Without masses, retractions, discharge or axillary adenopathy. Gentitourinary   Inguinal/mons:  Normal without inguinal adenopathy  External genitalia:  Normal  BUS/Urethra/Skene's glands:   Normal  Vagina: Atrophic  Cervix:  Normal  Uterus:  normal in size, shape and contour.  Midline and mobile  Adnexa/parametria:     Rt: Without masses or tenderness.   Lt: Without masses or tenderness.  Anus and perineum: Normal  Digital rectal exam: Normal sphincter tone without palpated masses or tenderness  Assessment/Plan:  78 y.o. [employer] G2, P2 for breast and pelvic exam with no complaints.  Postmenopausal/no HRT/no bleeding 07/2016 right breast cancer lumpectomy/radiation on Arimidex per oncologist Osteoporosis  on Prolia Squamous skin cancer-annual skin checks Hypercholesteremia primary care manages labs and meds  Plan: Prolia 60 mg subcu every 6 months.  Home safety, fall prevention and importance of weightbearing and balance type exercise reviewed and encouraged to continue.  SBEs, continue annual screening mammogram, calcium rich foods, vitamin D 2000 daily encouraged.  Encouraged to continue leisure and social activities.    Huel Cote [facility], 8:35 AM 08/28/2018

## 2018-08-28 NOTE — Progress Notes (Signed)
Crystal Tate

## 2018-08-28 NOTE — Telephone Encounter (Signed)
Prolia Given 08/28/2018 Next injection 03/01/2019

## 2018-08-28 NOTE — Patient Instructions (Signed)
Health Maintenance After Age 77 After age 77, you are at a higher risk for certain long-term diseases and infections as well as injuries from falls. Falls are a major cause of broken bones and head injuries in people who are older than age 77. Getting regular preventive care can help to keep you healthy and well. Preventive care includes getting regular testing and making lifestyle changes as recommended by your health care provider. Talk with your health care provider about:  Which screenings and tests you should have. A screening is a test that checks for a disease when you have no symptoms.  A diet and exercise plan that is right for you. What should I know about screenings and tests to prevent falls? Screening and testing are the best ways to find a health problem early. Early diagnosis and treatment give you the best chance of managing medical conditions that are common after age 77. Certain conditions and lifestyle choices may make you more likely to have a fall. Your health care provider may recommend:  Regular vision checks. Poor vision and conditions such as cataracts can make you more likely to have a fall. If you wear glasses, make sure to get your prescription updated if your vision changes.  Medicine review. Work with your health care provider to regularly review all of the medicines you are taking, including over-the-counter medicines. Ask your health care provider about any side effects that may make you more likely to have a fall. Tell your health care provider if any medicines that you take make you feel dizzy or sleepy.  Osteoporosis screening. Osteoporosis is a condition that causes the bones to get weaker. This can make the bones weak and cause them to break more easily.  Blood pressure screening. Blood pressure changes and medicines to control blood pressure can make you feel dizzy.  Strength and balance checks. Your health care provider may recommend certain tests to check your  strength and balance while standing, walking, or changing positions.  Foot health exam. Foot pain and numbness, as well as not wearing proper footwear, can make you more likely to have a fall.  Depression screening. You may be more likely to have a fall if you have a fear of falling, feel emotionally low, or feel unable to do activities that you used to do.  Alcohol use screening. Using too much alcohol can affect your balance and may make you more likely to have a fall. What actions can I take to lower my risk of falls? General instructions  Talk with your health care provider about your risks for falling. Tell your health care provider if: ? You fall. Be sure to tell your health care provider about all falls, even ones that seem minor. ? You feel dizzy, sleepy, or off-balance.  Take over-the-counter and prescription medicines only as told by your health care provider. These include any supplements.  Eat a healthy diet and maintain a healthy weight. A healthy diet includes low-fat dairy products, low-fat (lean) meats, and fiber from whole grains, beans, and lots of fruits and vegetables. Home safety  Remove any tripping hazards, such as rugs, cords, and clutter.  Install safety equipment such as grab bars in bathrooms and safety rails on stairs.  Keep rooms and walkways well-lit. Activity   Follow a regular exercise program to stay fit. This will help you maintain your balance. Ask your health care provider what types of exercise are appropriate for you.  If you need a cane or   walker, use it as recommended by your health care provider.  Wear supportive shoes that have nonskid soles. Lifestyle  Do not drink alcohol if your health care provider tells you not to drink.  If you drink alcohol, limit how much you have: ? 0-1 drink a day for women. ? 0-2 drinks a day for men.  Be aware of how much alcohol is in your drink. In the [place] one drink equals one typical bottle of beer (12  oz), one-half glass of wine (5 oz), or one shot of hard liquor (1 oz).  Do not use any products that contain nicotine or tobacco, such as cigarettes and e-cigarettes. If you need help quitting, ask your health care provider. Summary  Having a healthy lifestyle and getting preventive care can help to protect your health and wellness after age 23.  Screening and testing are the best way to find a health problem early and help you avoid having a fall. Early diagnosis and treatment give you the best chance for managing medical conditions that are more common for people who are older than age 19.  Falls are a major cause of broken bones and head injuries in people who are older than age 39. Take precautions to prevent a fall at home.  Work with your health care provider to learn what changes you can make to improve your health and wellness and to prevent falls. This information is not intended to replace advice given to you by your health care provider. Make sure you discuss any questions you have with your health care provider. Document Released: 02/21/2017 Document Revised: 02/21/2017 Document Reviewed: 02/21/2017 Elsevier Interactive Patient Education  2019 [employer]. Live Zoster (Shingles) Vaccine, ZVL: What You Need to Know 1. What is shingles? Shingles (also called herpes zoster, or just zoster) is a painful skin rash, often with blisters. Shingles is caused by the varicella zoster virus, the same virus that causes chickenpox. After you have chickenpox, the virus stays in your body and can cause shingles later in life. You can't catch shingles from another person. However, a person who has never had chickenpox (or chickenpox vaccine) could get chickenpox from someone with shingles. A shingles rash usually appears on one side of the face or body and heals within 2 to 4 weeks. Its main symptom is pain, which can be severe. Other symptoms can include fever, headache, chills, and upset stomach.  Very rarely, a shingles infection can lead to pneumonia, hearing problems, blindness, brain inflammation (encephalitis), or death. For about 1 person in 5, severe pain can continue even long after the rash has cleared up. This long-lasting pain is called post-herpetic neuralgia (PHN). Shingles is far more common in people 70 years of age and older than in younger people, and the risk increases with age. It is also more common in people whose immune system is weakened because of a disease such as cancer or by drugs such as steroids or chemotherapy. At least 1 million people a year in the [place] get shingles. 2. Shingles vaccine (live) A live shingles vaccine was approved by FDA in 2006. In a clinical trial, the vaccine reduced the risk of shingles by about 50% in people 60 and older. It can reduce the likelihood of PHN, and reduce pain in some people who still get shingles after being vaccinated. The recommended schedule for live shingles vaccine is a single dose for adults 28 years of age and older. 3. Some people should not get this vaccine  Tell your vaccine provider if you:  Have any severe, life-threatening allergies. A person who has ever had a life-threatening allergic reaction after a dose of live shingles vaccine, or has a severe allergy to any component of this vaccine, may be advised not to be vaccinated. Ask your health care provider if you want information about vaccine components.  Are pregnant, or think you might be pregnant. Pregnant women should wait to get live shingles vaccine until they are no longer pregnant. Women should avoid getting pregnant for at least 1 month after getting shingles vaccine.  Have a weakened immune system due to disease (such as cancer or AIDS) or medical treatments (such as radiation, immunotherapy, high-dose steroids, or chemotherapy).  Are not feeling well. If you have a mild illness, such as a cold, you can probably get the vaccine today. If you are  moderately or severely ill, you should probably wait until you recover. Your doctor can advise you. 4. Risks of a vaccine reaction With any medicine, including vaccines, there is a chance of reactions. After live shingles vaccination, a person might experience:  Redness, soreness, swelling, or itching at the site of the injection  Headache These events are usually mild and go away on their own. Rarely, live shingles vaccine can cause rash or shingles. Other things that could happen after this vaccine:  People sometimes faint after medical procedures, including vaccination. Sitting or lying down for about 15 minutes can help prevent fainting and injuries caused by a fall. Tell your provider if you feel dizzy or have vision changes or ringing in the ears.  Some people get shoulder pain that can be more severe and longer-lasting than routine soreness that can follow injections. This happens very rarely.  Any medication can cause a severe allergic reaction. Such reactions to a vaccine are estimated at about 1 in a million doses, and would happen within a few minutes to a few hours after the vaccination. As with any medicine, there is a very remote chance of a vaccine causing a serious injury or death. The safety of vaccines is always being monitored. For more information, visit: http://www.aguilar.org/ 5. What if there is a serious problem? What should I look for?  Look for anything that concerns you, such as signs of a severe allergic reaction, very high fever, or unusual behavior. Signs of a severe allergic reaction can include hives, swelling of the face and throat, difficulty breathing, a fast heartbeat, dizziness, and weakness. These would usually start a few minutes to a few hours after the vaccination. What should I do?  If you think it is a severe allergic reaction or other emergency that can't wait, call 9-1-1 and get to the nearest hospital. Otherwise, call your health care  provider.  Afterward, the reaction should be reported to the Vaccine Adverse Event Reporting System (VAERS). Your doctor should file this report, or you can do it yourself through the VAERS web site at www.vaers.SamedayNews.es, or by calling (515)423-9700. VAERS does not give medical advice. 6. How can I learn more?  Ask your healthcare provider. He or she can give you the vaccine package insert or suggest other sources of information.  Call your local or state health department.  Contact the Centers for Disease Control and Prevention (CDC): ? Call 947-625-3220(1-800-CDC-INFO) or ? Visit CDC's website at http://hunter.com/ CDC Vaccine Information Statement Live Zoster Vaccine (06/05/2016) This information is not intended to replace advice given to you by your health care provider. Make sure you discuss  any questions you have with your health care provider. Document Released: 02/05/2006 Document Revised: 11/20/2017 Document Reviewed: 11/20/2017 Elsevier Interactive Patient Education  2019 [employer].

## 2018-08-29 LAB — URINALYSIS, COMPLETE W/RFL CULTURE
Bacteria, UA: NONE SEEN /HPF
Bilirubin Urine: NEGATIVE
Glucose, UA: NEGATIVE
Hgb urine dipstick: NEGATIVE
Hyaline Cast: NONE SEEN /LPF
Ketones, ur: NEGATIVE
Leukocyte Esterase: NEGATIVE
Nitrites, Initial: NEGATIVE
Protein, ur: NEGATIVE
RBC / HPF: NONE SEEN /HPF (ref 0–2)
Specific Gravity, Urine: 1.014 (ref 1.001–1.03)
Squamous Epithelial / LPF: NONE SEEN /HPF (ref <=5)
WBC, UA: NONE SEEN /HPF (ref 0–5)
pH: 6.5 (ref 5.0–8.0)

## 2018-08-29 LAB — NO CULTURE INDICATED

## 2018-09-17 ENCOUNTER — Other Ambulatory Visit: Payer: Self-pay

## 2018-09-17 ENCOUNTER — Ambulatory Visit
Admission: RE | Admit: 2018-09-17 | Discharge: 2018-09-17 | Disposition: A | Discharge status: Home / Self Care | Payer: Medicare Other | Source: Ambulatory Visit | Attending: Women's Health | Admitting: Women's Health

## 2018-09-17 DIAGNOSIS — Z853 Personal history of malignant neoplasm of breast: Secondary | ICD-10-CM

## 2018-09-24 ENCOUNTER — Encounter (INDEPENDENT_AMBULATORY_CARE_PROVIDER_SITE_OTHER): Payer: Medicare Other | Admitting: Ophthalmology

## 2018-09-24 ENCOUNTER — Other Ambulatory Visit: Payer: Self-pay

## 2018-09-24 DIAGNOSIS — H43813 Vitreous degeneration, bilateral: Secondary | ICD-10-CM

## 2018-09-24 DIAGNOSIS — H2513 Age-related nuclear cataract, bilateral: Secondary | ICD-10-CM | POA: Diagnosis not present

## 2018-09-24 DIAGNOSIS — D3131 Benign neoplasm of right choroid: Secondary | ICD-10-CM

## 2018-11-15 ENCOUNTER — Encounter: Payer: Self-pay | Admitting: Anesthesiology

## 2018-11-28 ENCOUNTER — Telehealth: Payer: Self-pay | Admitting: Hematology and Oncology

## 2018-11-28 NOTE — Telephone Encounter (Signed)
I talk with patient regarding video visit °

## 2018-11-28 NOTE — Assessment & Plan Note (Addendum)
09/18/16: Rt Lumpectomy: IDC grade 2, 1.7 cm, 0/1 LN Neg, Er 100%, PR 100%, Her 2 Neg, T1CN0 Stage 1A Oncotype score 10, risk of recurrence 7%, low risk Adjuvant radiation therapy 12/13/2016-01/10/2017  Treatment plan: Adjuvant antiestrogen therapy with anastrozole 1 mg by mouth daily 5 years  Anastrozole toxicities: Denies any adverse effects to anastrozole therapy.  Breast cancer surveillance: 1.  Breast exam 04/12/2017: Benign 2. Mammogram 09/17/2018: No evidence of malignancy breast density category C 3.  Bone density 12/21/2017: T score -3.5: Osteoporosis: Currently on Prolia and calcium and vitamin D  Return to clinic in 1 year for follow-up

## 2018-12-04 ENCOUNTER — Telehealth: Payer: Self-pay | Admitting: Hematology and Oncology

## 2018-12-04 NOTE — Telephone Encounter (Signed)
Left message for patient to verify mychart visit for pre reg °

## 2018-12-04 NOTE — Progress Notes (Signed)
HEMATOLOGY-ONCOLOGY MYCHART VIDEO VISIT PROGRESS NOTE  I connected with Crystal Tate on 12/05/2018 at 10:00 AM EDT by MyChart video conference and verified that I am speaking with the correct person using two identifiers.  I discussed the limitations, risks, security and privacy concerns of performing an evaluation and management service by MyChart and the availability of in person appointments.  I also discussed with the patient that there may be a patient responsible charge related to this service. The patient expressed understanding and agreed to proceed.  Patient's Location: Home Physician Location: Clinic  CHIEF COMPLIANT: Follow-up of right breast cancer on anastrozole therapy  INTERVAL HISTORY: Crystal Tate is a 78 y.o. female with above-mentioned history of right breast cancer treated with lumpectomy, radiation, and who is currently on anti-estrogen therapy with anastrozole. I last saw her a year ago. Bone density scan on 12/20/17 showed osteoporosis with a T-score of -3.5. Mammogram on 09/17/18 showed no evidence of malignancy bilaterally.She presents over MyChart today for annual follow-up.   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%, PR 100%, Ki-67 10%, HER-2 negative ratio 1.27, grade 1-2, T1c N0 stage IA clinical stage   09/18/2016 Surgery   Rt Lumpectomy: IDC grade 2, 1.7 cm, 0/1 LN Neg, Er 100%, PR 100%, Her 2 Neg, T1CN0 Stage 1A   10/09/2016 Oncotype testing   Oncotype score 10; risk of recurrence 7%   12/13/2016 - 01/10/2017 Radiation Therapy   Adjuvant radiation   12/2016 -  Anti-estrogen oral therapy   Anastrozole daily x 5 years     REVIEW OF SYSTEMS:   Constitutional: Denies fevers, chills or abnormal weight loss Eyes: Denies blurriness of  vision Ears, nose, mouth, throat, and face: Denies mucositis or sore throat Respiratory: Denies cough, dyspnea or wheezes Cardiovascular: Denies palpitation, chest discomfort Gastrointestinal:  Denies nausea, heartburn or change in bowel habits Skin: Denies abnormal skin rashes Lymphatics: Denies new lymphadenopathy or easy bruising Neurological:Denies numbness, tingling or new weaknesses Behavioral/Psych: Mood is stable, no new changes  Extremities: No lower extremity edema Breast: denies any pain or lumps or nodules in either breasts All other systems were reviewed with the patient and are negative.  Observations/Objective:  There were no vitals filed for this visit. There is no height or weight on file to calculate BMI.  I have reviewed the data as listed CMP Latest Ref Rng & Units 09/08/2016 05/06/2015 10/20/2014  Glucose 65 - 99 mg/dL 110(H) - -  BUN 6 - 20 mg/dL 9 - -  Creatinine 0.44 - 1.00 mg/dL 0.79 - -  Sodium 135 - 145 mmol/L 139 - -  Potassium 3.5 - 5.1 mmol/L 4.5 - -  Chloride 101 - 111 mmol/L 104 - -  CO2 22 - 32 mmol/L 28 - -  Calcium 8.9 - 10.3 mg/dL 9.3 9.4 9.3  Total Protein 6.5 - 8.1 g/dL 6.9 - -  Total Bilirubin 0.3 - 1.2 mg/dL 0.5 - -  Alkaline Phos 38 - 126 U/L 30(L) - -  AST 15 - 41 U/L 20 - -  ALT 14 - 54 U/L 19 - -    Lab Results  Component Value Date   WBC 4.8 09/08/2016   HGB 13.8 09/08/2016   HCT 42.4 09/08/2016   MCV 96.4 09/08/2016   PLT 332  09/08/2016   NEUTROABS 2.5 09/08/2016      Assessment Plan:  Malignant neoplasm of upper-outer quadrant of right breast in female, estrogen receptor positive ([place]) 09/18/16: Rt Lumpectomy: IDC grade 2, 1.7 cm, 0/1 LN Neg, Er 100%, PR 100%, Her 2 Neg, T1CN0 Stage 1A Oncotype score 10, risk of recurrence 7%, low risk Adjuvant radiation therapy 12/13/2016-01/10/2017  Treatment plan: Adjuvant antiestrogen therapy with anastrozole 1 mg by mouth daily 5 years  Anastrozole toxicities: Denies any adverse  effects to anastrozole therapy. Occasional hot flashes  Breast cancer surveillance: 1.  Breast exam 04/12/2017: Benign 2. Mammogram 09/17/2018: No evidence of malignancy breast density category C 3.  Bone density 12/21/2017: T score -3.5: Osteoporosis: Currently on Prolia and calcium and vitamin D  Return to clinic in 1 year for follow-up   I discussed the assessment and treatment plan with the patient. The patient was provided an opportunity to ask questions and all were answered. The patient agreed with the plan and demonstrated an understanding of the instructions. The patient was advised to call back or seek an in-person evaluation if the symptoms worsen or if the condition fails to improve as anticipated.   I provided 15 minutes of face-to-face MyChart video visit time during this encounter.    Rulon Eisenmenger, MD 12/05/2018   I, Molly Dorshimer, am acting as scribe for Nicholas Lose, MD.  I have reviewed the above documentation for accuracy and completeness, and I agree with the above.

## 2018-12-05 ENCOUNTER — Inpatient Hospital Stay: Payer: Medicare Other | Attending: Hematology and Oncology | Admitting: Hematology and Oncology

## 2018-12-05 DIAGNOSIS — C50411 Malignant neoplasm of upper-outer quadrant of right female breast: Secondary | ICD-10-CM | POA: Diagnosis not present

## 2018-12-05 DIAGNOSIS — Z170 Estrogen receptor positive status [ER+]: Secondary | ICD-10-CM | POA: Diagnosis not present

## 2018-12-05 MED ORDER — ANASTROZOLE 1 MG PO TABS: 1.0000 mg | ORAL_TABLET | Freq: Every day | ORAL | 3 refills | Status: DC

## 2018-12-06 ENCOUNTER — Telehealth: Payer: Self-pay | Admitting: Hematology and Oncology

## 2018-12-06 NOTE — Telephone Encounter (Signed)
I left a message regarding schedule  

## 2019-01-21 ENCOUNTER — Telehealth: Payer: Self-pay

## 2019-01-21 ENCOUNTER — Telehealth: Payer: Self-pay | Admitting: *Deleted

## 2019-01-21 NOTE — Telephone Encounter (Signed)
RN spoke with patient regarding concerns with mood changes and hot flashes.  Pt denies any SI or intent to harm self.   Pt reports, "my whole body feels hot all the time."  Pt denies fever, and joint pain.  Small amount of nausea, that subsides.  Pt reports to PCP and they recommended her call oncologist due to anastrozole therapy.   RN reviewed with MD.   Pt to hold anastrozole for 2 weeks and call back to clinic to update on symptoms.  If symptoms have subsided consider anastrozole with Effexor to control hot flashes and mood changes.

## 2019-01-21 NOTE — Telephone Encounter (Signed)
Voicemail forwarded to collaborative nurse.   "Crystal Tate [phone]).  Calling because of medicine I take for my breast cancer.  Having reactions."

## 2019-01-30 ENCOUNTER — Encounter: Payer: Self-pay | Admitting: Gynecology

## 2019-02-06 ENCOUNTER — Telehealth: Payer: Self-pay

## 2019-02-06 NOTE — Telephone Encounter (Signed)
Pt called to report she has held anastrozole for 2 weeks.  Previous symptoms were extreme body heat and mood changes.    Pt states symptoms are still present, however not as severe.  Pt saw PCP this week, PCP has ordered extensive lab work that will be collected in the next 2 weeks.

## 2019-02-07 ENCOUNTER — Telehealth: Payer: Self-pay

## 2019-02-07 MED ORDER — VENLAFAXINE HCL ER 37.5 MG PO CP24: 37.5000 mg | ORAL_CAPSULE | Freq: Every day | ORAL | 2 refills | Status: DC

## 2019-02-07 NOTE — Telephone Encounter (Signed)
RN placed call, voicemail left for return call.  °

## 2019-02-07 NOTE — Telephone Encounter (Signed)
Per MD recommendations, add Effexor 37.5mg  with Anastrozole to help control mood changes, and hot flashes.   RN notified patient, patient voiced understanding.   Pt will call clinic if symptoms return, or worsen.

## 2019-02-24 ENCOUNTER — Telehealth: Payer: Self-pay

## 2019-02-24 NOTE — Telephone Encounter (Signed)
RN spoke with patient regarding questions with Effexor.  RN answered all questions.  Pt requested CBC results, RN informed no labs have been performed.  Pt voiced she has had labs drawn with PCP, she will get a copy of labs so we have on file.

## 2019-02-27 ENCOUNTER — Telehealth: Payer: Self-pay

## 2019-02-27 NOTE — Telephone Encounter (Signed)
Pt called to inquire about upcoming injection appointments.  Pt reports that she has been receiving her Prolia injection at her GYN physicians office, and would like to continue to have injections at this office.    RN informed patient we would cancel upcoming injection appointments.   No further needs at this time.

## 2019-03-06 ENCOUNTER — Telehealth: Payer: Self-pay | Admitting: *Deleted

## 2019-03-06 NOTE — Telephone Encounter (Signed)
Prolia insurance verification has been sent awaiting Summary of benefits  

## 2019-03-07 ENCOUNTER — Ambulatory Visit: Payer: Medicare Other

## 2019-03-07 ENCOUNTER — Other Ambulatory Visit: Payer: Medicare Other

## 2019-03-10 NOTE — Telephone Encounter (Addendum)
Deductible N/A  OOP MAX  Annual exam $3600($267.57met)  Calcium 9.8            Date 12/10/2018 Upcoming dental procedures   Prior Authorization needed NO  Pt estimated Cost $243       Coverage Details: 20% ONE DOSE,$30 ADMIN FEE

## 2019-03-11 ENCOUNTER — Other Ambulatory Visit: Payer: Self-pay

## 2019-03-11 ENCOUNTER — Ambulatory Visit (INDEPENDENT_AMBULATORY_CARE_PROVIDER_SITE_OTHER): Payer: Medicare Other | Admitting: Anesthesiology

## 2019-03-11 DIAGNOSIS — M810 Age-related osteoporosis without current pathological fracture: Secondary | ICD-10-CM | POA: Diagnosis not present

## 2019-03-11 MED ORDER — DENOSUMAB 60 MG/ML ~~LOC~~ SOSY
60.0000 mg | PREFILLED_SYRINGE | Freq: Once | SUBCUTANEOUS | Status: AC
  Administered 2019-03-11: 14:00:00 60 mg via SUBCUTANEOUS

## 2019-05-10 ENCOUNTER — Other Ambulatory Visit: Payer: Self-pay | Admitting: Hematology and Oncology

## 2019-05-15 NOTE — Telephone Encounter (Addendum)
PROLIA GIVEN 03/11/2019 NEXT INJECTION 09/09/2019

## 2019-05-26 ENCOUNTER — Telehealth: Payer: Self-pay

## 2019-05-26 NOTE — Telephone Encounter (Signed)
Pt called to report potential side effects from medication.   Pt states since starting medication, Venlafaxine, she continues with hot flashes but also is having some issues with diarrhea.   Pt reports taking medication in AM, with food.    RN educated patient on tapering Venlafaxine per MD recommendations.  Pt voiced understanding.   Pt feels she is able to tolerate hot flashes, and mood changes however if this changes she will call office to inquire about recommendations.

## 2019-05-29 ENCOUNTER — Encounter: Payer: Self-pay | Admitting: Women's Health

## 2019-06-04 ENCOUNTER — Other Ambulatory Visit: Payer: Self-pay | Admitting: *Deleted

## 2019-06-04 ENCOUNTER — Telehealth: Payer: Self-pay | Admitting: *Deleted

## 2019-06-04 MED ORDER — VENLAFAXINE HCL ER 37.5 MG PO CP24: ORAL_CAPSULE | ORAL | 2 refills | Status: DC

## 2019-06-04 NOTE — Telephone Encounter (Signed)
Received call from pt stating she wants to re start taking her Effexor 37.5 mg capsule.  Pt states she thought she would be able tolerate the hot flashes from the anastrozole but can not.  Pt states hot flashes were controlled while taking Effexor and will start back on it.

## 2019-08-07 ENCOUNTER — Telehealth: Payer: Self-pay | Admitting: *Deleted

## 2019-08-07 NOTE — Telephone Encounter (Addendum)
Deductible N/A  OOP MAX $3600 ($50MET)  Annual exam VISIT 09/02/2019 TF  Calcium 9.7           Date 09/01/2019  Upcoming dental procedures   Prior Authorization needed NO  Pt estimated Cost $243   Pt states she will be out of town until October will discuss getting prolia with Marny Lowenstein NP at her visit    Coverage Details: 205 ONE DOSE,20% ADMIN FEE

## 2019-08-20 ENCOUNTER — Other Ambulatory Visit: Payer: Self-pay | Admitting: Surgery

## 2019-08-20 DIAGNOSIS — N6310 Unspecified lump in the right breast, unspecified quadrant: Secondary | ICD-10-CM

## 2019-08-21 ENCOUNTER — Ambulatory Visit
Admission: RE | Admit: 2019-08-21 | Discharge: 2019-08-21 | Disposition: A | Discharge status: Home / Self Care | Payer: Medicare Other | Source: Ambulatory Visit | Attending: Surgery | Admitting: Surgery

## 2019-08-21 ENCOUNTER — Other Ambulatory Visit: Payer: Self-pay

## 2019-08-21 DIAGNOSIS — N6310 Unspecified lump in the right breast, unspecified quadrant: Secondary | ICD-10-CM

## 2019-08-29 ENCOUNTER — Other Ambulatory Visit: Payer: Self-pay

## 2019-08-30 ENCOUNTER — Other Ambulatory Visit: Payer: Self-pay | Admitting: Hematology and Oncology

## 2019-09-01 ENCOUNTER — Ambulatory Visit: Payer: Medicare Other | Admitting: Nurse Practitioner

## 2019-09-01 ENCOUNTER — Encounter: Payer: Self-pay | Admitting: Nurse Practitioner

## 2019-09-01 ENCOUNTER — Other Ambulatory Visit: Payer: Self-pay

## 2019-09-01 VITALS — BP 110/70 | Ht 62.0 in | Wt 112.0 lb

## 2019-09-01 DIAGNOSIS — Z01419 Encounter for gynecological examination (general) (routine) without abnormal findings: Secondary | ICD-10-CM

## 2019-09-01 DIAGNOSIS — Z853 Personal history of malignant neoplasm of breast: Secondary | ICD-10-CM

## 2019-09-01 DIAGNOSIS — Z9289 Personal history of other medical treatment: Secondary | ICD-10-CM

## 2019-09-01 DIAGNOSIS — Z780 Asymptomatic menopausal state: Secondary | ICD-10-CM

## 2019-09-01 DIAGNOSIS — C50411 Malignant neoplasm of upper-outer quadrant of right female breast: Secondary | ICD-10-CM

## 2019-09-01 DIAGNOSIS — M810 Age-related osteoporosis without current pathological fracture: Secondary | ICD-10-CM | POA: Diagnosis not present

## 2019-09-01 DIAGNOSIS — Z170 Estrogen receptor positive status [ER+]: Secondary | ICD-10-CM

## 2019-09-01 LAB — CALCIUM: Calcium: 9.7 mg/dL (ref 8.6–10.4)

## 2019-09-01 NOTE — Patient Instructions (Signed)
Health Maintenance After Age 78 After age 78, you are at a higher risk for certain long-term diseases and infections as well as injuries from falls. Falls are a major cause of broken bones and head injuries in people who are older than age 78. Getting regular preventive care can help to keep you healthy and well. Preventive care includes getting regular testing and making lifestyle changes as recommended by your health care provider. Talk with your health care provider about:  Which screenings and tests you should have. A screening is a test that checks for a disease when you have no symptoms.  A diet and exercise plan that is right for you. What should I know about screenings and tests to prevent falls? Screening and testing are the best ways to find a health problem early. Early diagnosis and treatment give you the best chance of managing medical conditions that are common after age 78. Certain conditions and lifestyle choices may make you more likely to have a fall. Your health care provider may recommend:  Regular vision checks. Poor vision and conditions such as cataracts can make you more likely to have a fall. If you wear glasses, make sure to get your prescription updated if your vision changes.  Medicine review. Work with your health care provider to regularly review all of the medicines you are taking, including over-the-counter medicines. Ask your health care provider about any side effects that may make you more likely to have a fall. Tell your health care provider if any medicines that you take make you feel dizzy or sleepy.  Osteoporosis screening. Osteoporosis is a condition that causes the bones to get weaker. This can make the bones weak and cause them to break more easily.  Blood pressure screening. Blood pressure changes and medicines to control blood pressure can make you feel dizzy.  Strength and balance checks. Your health care provider may recommend certain tests to check your  strength and balance while standing, walking, or changing positions.  Foot health exam. Foot pain and numbness, as well as not wearing proper footwear, can make you more likely to have a fall.  Depression screening. You may be more likely to have a fall if you have a fear of falling, feel emotionally low, or feel unable to do activities that you used to do.  Alcohol use screening. Using too much alcohol can affect your balance and may make you more likely to have a fall. What actions can I take to lower my risk of falls? General instructions  Talk with your health care provider about your risks for falling. Tell your health care provider if: ? You fall. Be sure to tell your health care provider about all falls, even ones that seem minor. ? You feel dizzy, sleepy, or off-balance.  Take over-the-counter and prescription medicines only as told by your health care provider. These include any supplements.  Eat a healthy diet and maintain a healthy weight. A healthy diet includes low-fat dairy products, low-fat (lean) meats, and fiber from whole grains, beans, and lots of fruits and vegetables. Home safety  Remove any tripping hazards, such as rugs, cords, and clutter.  Install safety equipment such as grab bars in bathrooms and safety rails on stairs.  Keep rooms and walkways well-lit. Activity   Follow a regular exercise program to stay fit. This will help you maintain your balance. Ask your health care provider what types of exercise are appropriate for you.  If you need a cane or   walker, use it as recommended by your health care provider.  Wear supportive shoes that have nonskid soles. Lifestyle  Do not drink alcohol if your health care provider tells you not to drink.  If you drink alcohol, limit how much you have: ? 0-1 drink a day for women. ? 0-2 drinks a day for men.  Be aware of how much alcohol is in your drink. In the [place] one drink equals one typical bottle of beer (12  oz), one-half glass of wine (5 oz), or one shot of hard liquor (1 oz).  Do not use any products that contain nicotine or tobacco, such as cigarettes and e-cigarettes. If you need help quitting, ask your health care provider. Summary  Having a healthy lifestyle and getting preventive care can help to protect your health and wellness after age 78.  Screening and testing are the best way to find a health problem early and help you avoid having a fall. Early diagnosis and treatment give you the best chance for managing medical conditions that are more common for people who are older than age 78.  Falls are a major cause of broken bones and head injuries in people who are older than age 78. Take precautions to prevent a fall at home.  Work with your health care provider to learn what changes you can make to improve your health and wellness and to prevent falls. This information is not intended to replace advice given to you by your health care provider. Make sure you discuss any questions you have with your health care provider. Document Revised: 08/01/2018 Document Reviewed: 02/21/2017 Elsevier Patient Education  2020 Elsevier Inc.  

## 2019-09-01 NOTE — Telephone Encounter (Signed)
Pt will get Prolia in [place] obtained through [employer] [address] [phone]: Calcium labs drawn they need labs within 3 months.

## 2019-09-01 NOTE — Progress Notes (Signed)
   Crystal Tate [birth year] [id number]   History:  79 y.o. G2 P2 presents for breast and pelvic exam.  Postmenopausal-no HRT with no bleeding.  April 2018 right breast cancer with lumpectomy and radiation-taking Arimidex. Taking Effexor daily for vasomotor symptoms, prescribed by oncology. History of osteoporosis, on Prolia.  Has annual skin checks due to history of squamous skin cancer.  Complains of urgency that is not new for her.  Normal mammogram and Pap history.  Husband passed away from brain tumor 4 years ago. No currently sexually active.  Going to [place] going to visit daughter in [place] until October.  Gynecologic History Patient's last menstrual period was 02/28/1999.   Contraception: post menopausal status Last Pap: normal pap history, no longer screening Last mammogram: 08/21/2019 of right breast. Results were: normal DEXA: 10/03/2016.  Results:-3.5 spine, -0.9 hip  Past medical history, past surgical history, family history and social history were all reviewed and documented in the EPIC chart.  ROS:  A ROS was performed and pertinent positives and negatives are included.  Exam:  Vitals:   09/01/19 1102  BP: 110/70  Weight: 112 lb (50.8 kg)  Height: 5\' 2"  (1.575 m)   Body mass index is 20.49 kg/m.  General appearance:  Normal Thyroid:  Symmetrical, normal in size, without palpable masses or nodularity. Respiratory  Auscultation:  Clear without wheezing or rhonchi Cardiovascular  Auscultation:  Regular rate, without rubs, murmurs or gallops  Edema/varicosities:  Not grossly evident Abdominal  Soft,nontender, without masses, guarding or rebound.  Liver/spleen:  No organomegaly noted  Hernia:  None appreciated  Skin  Inspection:  Grossly normal   Breasts: Examined lying and sitting.   Right: Without masses, retractions, discharge or axillary adenopathy. Lumpectomy   Left: Without masses, retractions, discharge or axillary adenopathy. Gentitourinary    Inguinal/mons:  Normal without inguinal adenopathy  External genitalia:  Normal  BUS/Urethra/Skene's glands:  Normal  Vagina:  Atrophic, 1+ cystocele  Cervix:  Normal  Uterus:  Normal in size, shape and contour.  Midline and mobile  Adnexa/parametria:     Rt: Without masses or tenderness.   Lt: Without masses or tenderness.  Anus and perineum: Normal  Digital rectal exam: Normal sphincter tone without palpated masses or tenderness  Assessment/Plan:  79 y.o. [facility] G2P2 for breast and pelvic exam.   Senile osteoporosis - Plan: Prolia SQ every 6 months, weight-bearing exercises, daily calcium and Vitamin D. DEXA due this August  Well female exam with routine gynecological exam - Education provided on SBEs, importance of preventative screenings, current guidelines, high calcium diet, regular exercise, vitamin D and calcium daily, weight bearing exercises.  Postmenopausal-no HRT  Malignant neoplasm of upper-outer quadrant of right breast in female, estrogen receptor positive ([place])- screening guidelines reviewed, sees oncology    Follow up in 1 year for annual     [place], 11:12 AM 09/01/2019

## 2019-09-02 ENCOUNTER — Encounter: Payer: Medicare Other | Admitting: Nurse Practitioner

## 2019-09-04 ENCOUNTER — Other Ambulatory Visit: Payer: Medicare Other

## 2019-09-04 ENCOUNTER — Ambulatory Visit: Payer: Medicare Other

## 2019-09-26 ENCOUNTER — Encounter (INDEPENDENT_AMBULATORY_CARE_PROVIDER_SITE_OTHER): Payer: Medicare Other | Admitting: Ophthalmology

## 2019-09-30 NOTE — Telephone Encounter (Signed)
Pt is on Prolia cannot have due to pt being out of town until October.Requesting alternative oral medication while pt it out of town to treat osteoporosis. Previously on Fosamax for 3-4 years in the past with no improvement. Will route to Dr. Dellis Filbert for recommendations.

## 2019-09-30 NOTE — Telephone Encounter (Signed)
Received fax from [facility] referral insurance denial.

## 2019-10-01 NOTE — Telephone Encounter (Signed)
Spoke with Dr. Dellis Filbert in person. Recommendations when pt returns give Prolia. Make sure is taken her calcium, vitamin d as well as weight bearing exercises.

## 2019-10-03 NOTE — Telephone Encounter (Signed)
Spoke with Manufacturing engineer Copland to see if there was another option.Call a place pt is established at, in [place]. [place] [address]. See if they will administer Prolia. Tried calling several times placed on hold for longer than 15 min no answer [phone]. Will explain to pt she will get Prolia when she gets back from vacation.   Spoke with pt she is  aware of recommendations and is ok with waiting until she comes back

## 2019-12-07 ENCOUNTER — Other Ambulatory Visit: Payer: Self-pay | Admitting: Hematology and Oncology

## 2020-01-22 ENCOUNTER — Other Ambulatory Visit: Payer: Self-pay | Admitting: Hematology and Oncology

## 2020-01-26 NOTE — Telephone Encounter (Signed)
appt 02/09/2020

## 2020-02-09 ENCOUNTER — Other Ambulatory Visit: Payer: Self-pay

## 2020-02-09 ENCOUNTER — Encounter (INDEPENDENT_AMBULATORY_CARE_PROVIDER_SITE_OTHER): Payer: Medicare Other | Admitting: Ophthalmology

## 2020-02-09 ENCOUNTER — Ambulatory Visit: Payer: Medicare Other

## 2020-02-09 DIAGNOSIS — H2513 Age-related nuclear cataract, bilateral: Secondary | ICD-10-CM | POA: Diagnosis not present

## 2020-02-09 DIAGNOSIS — H43813 Vitreous degeneration, bilateral: Secondary | ICD-10-CM | POA: Diagnosis not present

## 2020-02-09 DIAGNOSIS — D3131 Benign neoplasm of right choroid: Secondary | ICD-10-CM

## 2020-02-11 ENCOUNTER — Ambulatory Visit (INDEPENDENT_AMBULATORY_CARE_PROVIDER_SITE_OTHER): Payer: Medicare Other | Admitting: *Deleted

## 2020-02-11 ENCOUNTER — Other Ambulatory Visit: Payer: Self-pay

## 2020-02-11 DIAGNOSIS — M810 Age-related osteoporosis without current pathological fracture: Secondary | ICD-10-CM | POA: Diagnosis not present

## 2020-02-11 MED ORDER — DENOSUMAB 60 MG/ML ~~LOC~~ SOSY
60.0000 mg | PREFILLED_SYRINGE | Freq: Once | SUBCUTANEOUS | Status: AC
  Administered 2020-02-11: 60 mg via SUBCUTANEOUS

## 2020-03-04 ENCOUNTER — Other Ambulatory Visit: Payer: Self-pay | Admitting: *Deleted

## 2020-03-04 DIAGNOSIS — Z170 Estrogen receptor positive status [ER+]: Secondary | ICD-10-CM

## 2020-03-04 DIAGNOSIS — C50411 Malignant neoplasm of upper-outer quadrant of right female breast: Secondary | ICD-10-CM

## 2020-03-04 NOTE — Progress Notes (Signed)
Patient Care Team: Nicola Girt, DO as PCP - General (Internal Medicine) Nicola Girt, DO Nicholas Lose, MD as Consulting Physician (Hematology and Oncology) Delice Bison, Charlestine Massed, NP as Nurse Practitioner (Hematology and Oncology) Kyung Rudd, MD as Consulting Physician (Radiation Oncology) Erroll Luna, MD as Consulting Physician (General Surgery)  DIAGNOSIS:    ICD-10-CM   1. Malignant neoplasm of upper-outer quadrant of right breast in female, estrogen receptor positive ([place])  C50.411    Z17.0     SUMMARY OF ONCOLOGIC HISTORY: Oncology History  Malignant neoplasm of upper-outer quadrant of right breast in female, estrogen receptor positive ([place])  08/09/2016 Initial Diagnosis   Right breast mass 11:00 position: 1.7 x 1.2 x 1.4 cm, no axillary lymph nodes; biopsy revealed invasive ductal carcinoma ER 100%, PR 100%, Ki-67 10%, HER-2 negative ratio 1.27, grade 1-2, T1c N0 stage IA clinical stage   09/18/2016 Surgery   Rt Lumpectomy: IDC grade 2, 1.7 cm, 0/1 LN Neg, Er 100%, PR 100%, Her 2 Neg, T1CN0 Stage 1A   10/09/2016 Oncotype testing   Oncotype score 10; risk of recurrence 7%   12/13/2016 - 01/10/2017 Radiation Therapy   Adjuvant radiation   12/2016 -  Anti-estrogen oral therapy   Anastrozole daily x 5 years     CHIEF COMPLIANT: Follow-up of right breast cancer on anastrozole therapy  INTERVAL HISTORY: Crystal Tate is a 79 y.o. with above-mentioned history of right breast cancer treated with lumpectomy, radiation, and who is currently on anti-estrogen therapy with anastrozole. Mammogram on 08/21/19 showed no evidence of malignancy in the right breast. She presents to the clinic today for annual follow-up.  She was started on Effexor last year and appears to be helping the feeling of hot inside of her. She thinks it is benefiting her and wants to stay on it.  ALLERGIES:  is allergic to other.  MEDICATIONS:  Current Outpatient Medications  Medication Sig Dispense  Refill  . anastrozole (ARIMIDEX) 1 MG tablet TAKE 1 TABLET(1 MG) BY MOUTH DAILY 90 tablet 0  . aspirin 81 MG tablet Take 81 mg by mouth daily.      . Biotin (PA BIOTIN) 1000 MCG tablet Take 5,000 mcg by mouth daily.     . calcium citrate-vitamin D (CITRACAL+D) 315-200 MG-UNIT per tablet Take 1 tablet by mouth 2 (two) times daily.    . Cyanocobalamin (VITAMIN B-12 PO) Take 1 tablet by mouth daily.    Marland Kitchen denosumab (PROLIA) 60 MG/ML SOSY injection Inject 60 mg into the skin every 6 (six) months.    . esomeprazole (NEXIUM) 20 MG capsule Take 20 mg by mouth daily at 12 noon.    Marland Kitchen ibuprofen (ADVIL,MOTRIN) 800 MG tablet Take 1 tablet (800 mg total) by mouth every 8 (eight) hours as needed. 30 tablet 0  . pravastatin (PRAVACHOL) 10 MG tablet Take 10 mg by mouth daily.      . valACYclovir (VALTREX) 1000 MG tablet Take 1 tablet by mouth as needed.   0  . venlafaxine XR (EFFEXOR-XR) 37.5 MG 24 hr capsule TAKE 1 CAPSULE(37.5 MG) BY MOUTH DAILY WITH BREAKFAST 30 capsule 2   No current facility-administered medications for this visit.    PHYSICAL EXAMINATION: ECOG PERFORMANCE STATUS: 1 - Symptomatic but completely ambulatory  Vitals:   03/05/20 0844  BP: 133/64  Pulse: 71  Resp: 17  Temp: 97.7 F (36.5 C)  SpO2: 99%   Filed Weights   03/05/20 0844  Weight: 116 lb 1.6 oz (52.7 kg)  BREAST: No palpable masses or nodules in either right or left breasts. No palpable axillary supraclavicular or infraclavicular adenopathy no breast tenderness or nipple discharge. (exam performed in the presence of a chaperone)  LABORATORY DATA:  I have reviewed the data as listed CMP Latest Ref Rng & Units 03/05/2020 09/01/2019 09/08/2016  Glucose 70 - 99 mg/dL 106(H) - 110(H)  BUN 8 - 23 mg/dL 13 - 9  Creatinine 0.44 - 1.00 mg/dL 0.81 - 0.79  Sodium 135 - 145 mmol/L 137 - 139  Potassium 3.5 - 5.1 mmol/L 4.7 - 4.5  Chloride 98 - 111 mmol/L 102 - 104  CO2 22 - 32 mmol/L 27 - 28  Calcium 8.9 - 10.3 mg/dL 9.1  9.7 9.3  Total Protein 6.5 - 8.1 g/dL 7.5 - 6.9  Total Bilirubin 0.3 - 1.2 mg/dL 0.5 - 0.5  Alkaline Phos 38 - 126 U/L 85 - 30(L)  AST 15 - 41 U/L 19 - 20  ALT 0 - 44 U/L 17 - 19    Lab Results  Component Value Date   WBC 4.3 03/05/2020   HGB 13.7 03/05/2020   HCT 41.1 03/05/2020   MCV 99.3 03/05/2020   PLT 343 03/05/2020   NEUTROABS 2.3 03/05/2020    ASSESSMENT & PLAN:  Malignant neoplasm of upper-outer quadrant of right breast in female, estrogen receptor positive ([place]) 09/18/16: Rt Lumpectomy: IDC grade 2, 1.7 cm, 0/1 LN Neg, Er 100%, PR 100%, Her 2 Neg, T1CN0 Stage 1A Oncotype score 10, risk of recurrence 7%, low risk Adjuvant radiation therapy 12/13/2016-01/10/2017  Treatment plan: Adjuvant antiestrogen therapy with anastrozole 1 mg by mouth daily 5 years  Anastrozoletoxicities: Denies any adverse effects to anastrozole therapy. Occasional hot flashes  Breast cancer surveillance: 1.Breast exam 03/05/2020: Benign 2.Mammogram  08/21/2019: Right breast mammogram no evidence of malignancy to evaluate a palpable abnormality. She did not get left breast mammogram. 3.  Bone density 12/21/2017: T score -3.5: Osteoporosis: Currently on Prolia and calcium and vitamin D.  She gets Prolia with her gynecologist.  Return to clinic in 1 year for follow-up    No orders of the defined types were placed in this encounter.  The patient has a good understanding of the overall plan. she agrees with it. she will call with any problems that may develop before the next visit here.  Total time spent: 20 mins including face to face time and time spent for planning, charting and coordination of care  Nicholas Lose, MD 03/05/2020  I, Cloyde Reams Dorshimer, am acting as scribe for Dr. Nicholas Lose.  I have reviewed the above documentation for accuracy and completeness, and I agree with the above.

## 2020-03-05 ENCOUNTER — Telehealth: Payer: Self-pay | Admitting: Hematology and Oncology

## 2020-03-05 ENCOUNTER — Inpatient Hospital Stay: Payer: Medicare Other

## 2020-03-05 ENCOUNTER — Inpatient Hospital Stay: Payer: Medicare Other | Attending: Hematology and Oncology | Admitting: Hematology and Oncology

## 2020-03-05 ENCOUNTER — Ambulatory Visit: Payer: Medicare Other

## 2020-03-05 ENCOUNTER — Other Ambulatory Visit: Payer: Self-pay

## 2020-03-05 DIAGNOSIS — Z170 Estrogen receptor positive status [ER+]: Secondary | ICD-10-CM

## 2020-03-05 DIAGNOSIS — Z923 Personal history of irradiation: Secondary | ICD-10-CM | POA: Insufficient documentation

## 2020-03-05 DIAGNOSIS — Z79811 Long term (current) use of aromatase inhibitors: Secondary | ICD-10-CM | POA: Insufficient documentation

## 2020-03-05 DIAGNOSIS — C50411 Malignant neoplasm of upper-outer quadrant of right female breast: Secondary | ICD-10-CM | POA: Diagnosis not present

## 2020-03-05 LAB — CBC WITH DIFFERENTIAL (CANCER CENTER ONLY)
Abs Immature Granulocytes: 0.01 10*3/uL (ref 0.00–0.07)
Basophils Absolute: 0 10*3/uL (ref 0.0–0.1)
Basophils Relative: 1 %
Eosinophils Absolute: 0 10*3/uL (ref 0.0–0.5)
Eosinophils Relative: 1 %
HCT: 41.1 % (ref 36.0–46.0)
Hemoglobin: 13.7 g/dL (ref 12.0–15.0)
Immature Granulocytes: 0 %
Lymphocytes Relative: 34 %
Lymphs Abs: 1.4 10*3/uL (ref 0.7–4.0)
MCH: 33.1 pg (ref 26.0–34.0)
MCHC: 33.3 g/dL (ref 30.0–36.0)
MCV: 99.3 fL (ref 80.0–100.0)
Monocytes Absolute: 0.5 10*3/uL (ref 0.1–1.0)
Monocytes Relative: 11 %
Neutro Abs: 2.3 10*3/uL (ref 1.7–7.7)
Neutrophils Relative %: 53 %
Platelet Count: 343 10*3/uL (ref 150–400)
RBC: 4.14 MIL/uL (ref 3.87–5.11)
RDW: 12.8 % (ref 11.5–15.5)
WBC Count: 4.3 10*3/uL (ref 4.0–10.5)
nRBC: 0 % (ref 0.0–0.2)

## 2020-03-05 LAB — CMP (CANCER CENTER ONLY)
ALT: 17 U/L (ref 0–44)
AST: 19 U/L (ref 15–41)
Albumin: 4.1 g/dL (ref 3.5–5.0)
Alkaline Phosphatase: 85 U/L (ref 38–126)
Anion gap: 8 (ref 5–15)
BUN: 13 mg/dL (ref 8–23)
CO2: 27 mmol/L (ref 22–32)
Calcium: 9.1 mg/dL (ref 8.9–10.3)
Chloride: 102 mmol/L (ref 98–111)
Creatinine: 0.81 mg/dL (ref 0.44–1.00)
GFR, Estimated: >60 mL/min (ref >60)
Glucose, Bld: 106 mg/dL — ABNORMAL HIGH (ref 70–99)
Potassium: 4.7 mmol/L (ref 3.5–5.1)
Sodium: 137 mmol/L (ref 135–145)
Total Bilirubin: 0.5 mg/dL (ref 0.3–1.2)
Total Protein: 7.5 g/dL (ref 6.5–8.1)

## 2020-03-05 MED ORDER — ANASTROZOLE 1 MG PO TABS: 1.0000 mg | ORAL_TABLET | Freq: Every day | ORAL | 3 refills | Status: DC

## 2020-03-05 MED ORDER — VENLAFAXINE HCL ER 37.5 MG PO CP24: ORAL_CAPSULE | ORAL | 3 refills | Status: DC

## 2020-03-05 NOTE — Telephone Encounter (Signed)
Scheduled appointments per 11/12 los. Spoke to patient who is aware of appointment date and time.

## 2020-03-05 NOTE — Assessment & Plan Note (Signed)
09/18/16: Rt Lumpectomy: IDC grade 2, 1.7 cm, 0/1 LN Neg, Er 100%, PR 100%, Her 2 Neg, T1CN0 Stage 1A Oncotype score 10, risk of recurrence 7%, low risk Adjuvant radiation therapy 12/13/2016-01/10/2017  Treatment plan: Adjuvant antiestrogen therapy with anastrozole 1 mg by mouth daily 5 years  Anastrozoletoxicities: Denies any adverse effects to anastrozole therapy. Occasional hot flashes  Breast cancer surveillance: 1.Breast exam 03/05/2020: Benign 2.Mammogram  08/21/2019: Right breast mammogram no evidence of malignancy to evaluate a palpable abnormality. She did not get left breast mammogram. 3.  Bone density 12/21/2017: T score -3.5: Osteoporosis: Currently on Prolia and calcium and vitamin D  Return to clinic in 1 year for follow-up

## 2020-05-26 ENCOUNTER — Other Ambulatory Visit: Payer: Self-pay | Admitting: Hematology and Oncology

## 2020-06-29 ENCOUNTER — Other Ambulatory Visit: Payer: Self-pay | Admitting: Obstetrics & Gynecology

## 2020-06-29 ENCOUNTER — Other Ambulatory Visit: Payer: Self-pay

## 2020-06-29 ENCOUNTER — Encounter (INDEPENDENT_AMBULATORY_CARE_PROVIDER_SITE_OTHER): Payer: Medicare Other

## 2020-06-29 DIAGNOSIS — M810 Age-related osteoporosis without current pathological fracture: Secondary | ICD-10-CM

## 2020-06-29 DIAGNOSIS — Z780 Asymptomatic menopausal state: Secondary | ICD-10-CM

## 2020-06-30 ENCOUNTER — Telehealth: Payer: Self-pay | Admitting: *Deleted

## 2020-06-30 NOTE — Telephone Encounter (Signed)
appt 02/11/2020 Next injection 08/12/2020

## 2020-06-30 NOTE — Telephone Encounter (Signed)
Prolia insurance verification has been sent awaiting Summary of benefits  

## 2020-06-30 NOTE — Telephone Encounter (Signed)
Deductible n/a  OOP MAX $3600 ($0)  Annual exam 09/01/2019 UPCOMING ANNUAL EXAM 09/02/2020  Calcium 9.1            Date 03/05/2020  Upcoming dental procedures NO  Prior Authorization needed NO  Pt estimated Cost $243  APPT SAME DAY AS ANNUAL EXAM     Coverage Details: 20% ADMIN FEE, $30 ONE DOSE

## 2020-07-13 ENCOUNTER — Telehealth: Payer: Self-pay

## 2020-07-13 NOTE — Telephone Encounter (Signed)
BD test  06/29/20. Calling for results. Results are not finalized yet.  I explained Dr. Marguerita Merles was out of office for a week the week before last and some things are a week behind.  I told her we will be mailing her result to her once it is finalized.

## 2020-08-02 ENCOUNTER — Other Ambulatory Visit: Payer: Self-pay | Admitting: Nurse Practitioner

## 2020-08-02 DIAGNOSIS — Z9889 Other specified postprocedural states: Secondary | ICD-10-CM

## 2020-08-12 IMAGING — US US BREAST*R* LIMITED INC AXILLA
1 series · 4 of 4 positions shown · non-contrast
Comparison: Previous exams including most recent bilateral
diagnostic mammogram dated 09/17/2018.

CLINICAL DATA: Patient describes of new palpable lump in the outer
RIGHT breast. History of RIGHT breast cancer in Monday August, 2016 status
post lumpectomy and radiation therapy.

EXAM:
DIGITAL DIAGNOSTIC RIGHT MAMMOGRAM WITH CAD AND TOMO
ULTRASOUND RIGHT BREAST

[Series 1: us breast*right* limited inc axilla · 0.06mm/px · 4 of 4 slices shown]
[im 1/4]
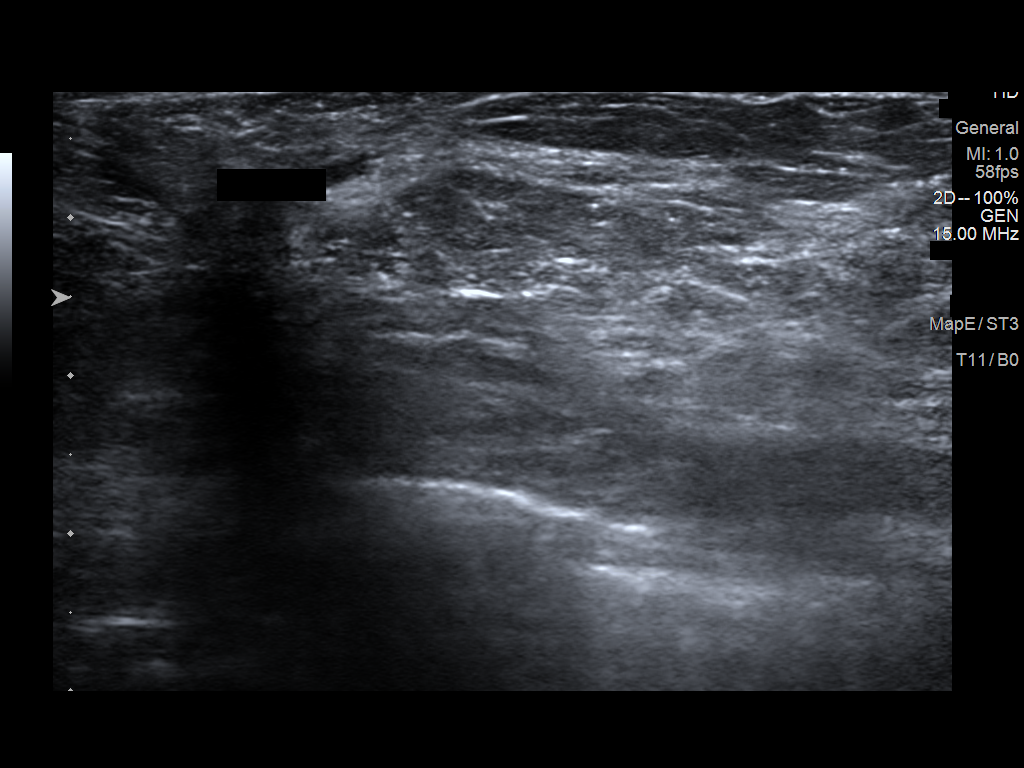
[im 2/4]
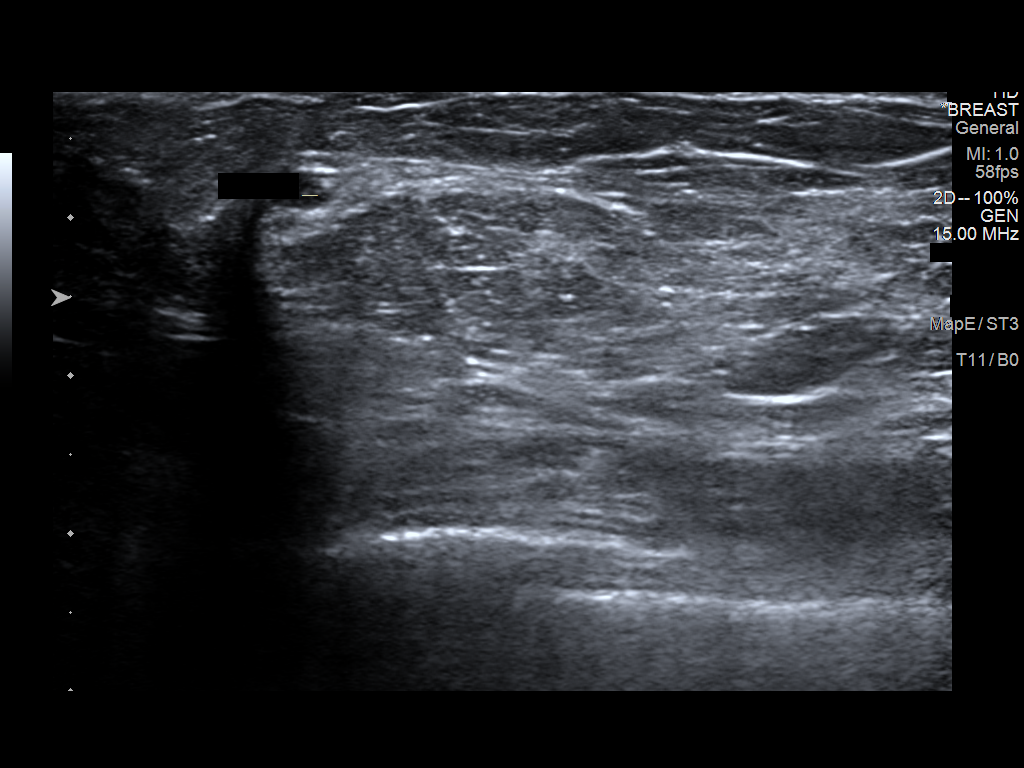
[im 3/4]
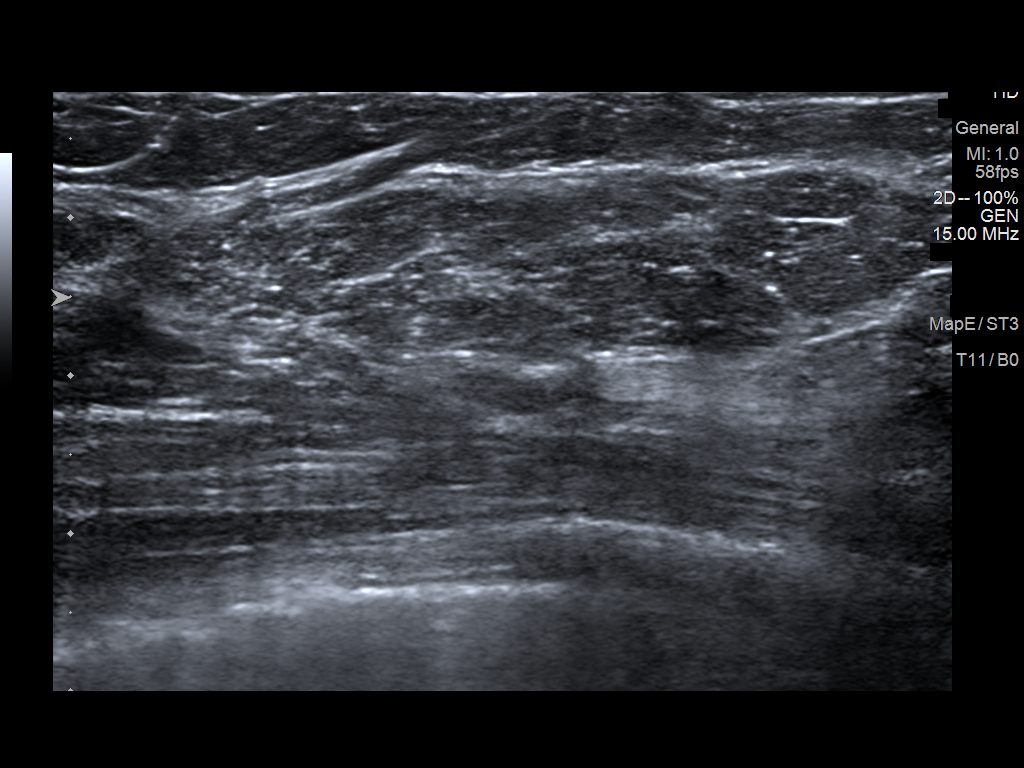
[im 4/4]
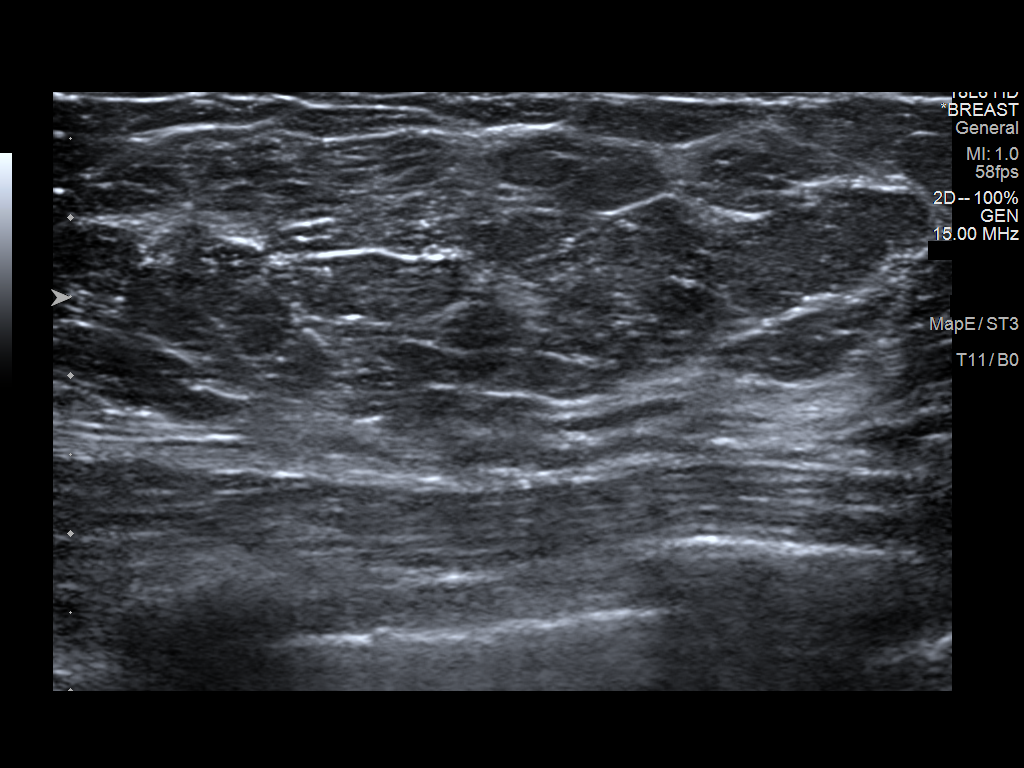

[4 of 4 positions shown; findings below may reference images not displayed]

ACR Breast Density Category b: There are scattered areas of
fibroglandular density.
FINDINGS: There are stable postsurgical changes within the outer RIGHT breast,
corresponding to the area of clinical concern with overlying skin
marker in place. There are no new dominant masses, suspicious
calcifications or secondary signs of malignancy within the RIGHT
breast.

Mammographic images were processed with CAD.

Targeted ultrasound is performed, evaluating the upper-outer
quadrant of the RIGHT breast with particular attention to the 10
o'clock axis as directed by the patient, showing only normal breast
tissues at the site of the palpable area of concern. No solid or
cystic mass. Expected postsurgical scar is seen nearby.
IMPRESSION: No evidence of malignancy within the RIGHT breast. Stable
postsurgical changes within the outer RIGHT breast.

RECOMMENDATION:
1. Annual mammograms. Next annual mammogram for the LEFT breast will
be due at the end Friday August, 2019.
2. The patient was instructed to return sooner if the area that she
feels becomes larger and/or firmer to palpation, or if a new
palpable abnormality is identified in either breast.

I have discussed the findings and recommendations with the patient.
If applicable, a reminder letter will be sent to the patient
regarding the next appointment.

BI-RADS CATEGORY  2: Benign.

## 2020-08-12 IMAGING — MG MM DIGITAL DIAGNOSTIC UNILAT*R* W/ TOMO W/ CAD
6 series · 6 of 18 positions shown · non-contrast
Comparison: Previous exams including most recent bilateral
diagnostic mammogram dated 09/17/2018.

CLINICAL DATA: Patient describes of new palpable lump in the outer
RIGHT breast. History of RIGHT breast cancer in Monday August, 2016 status
post lumpectomy and radiation therapy.

EXAM:
DIGITAL DIAGNOSTIC RIGHT MAMMOGRAM WITH CAD AND TOMO
ULTRASOUND RIGHT BREAST

[R CC synth-2D]
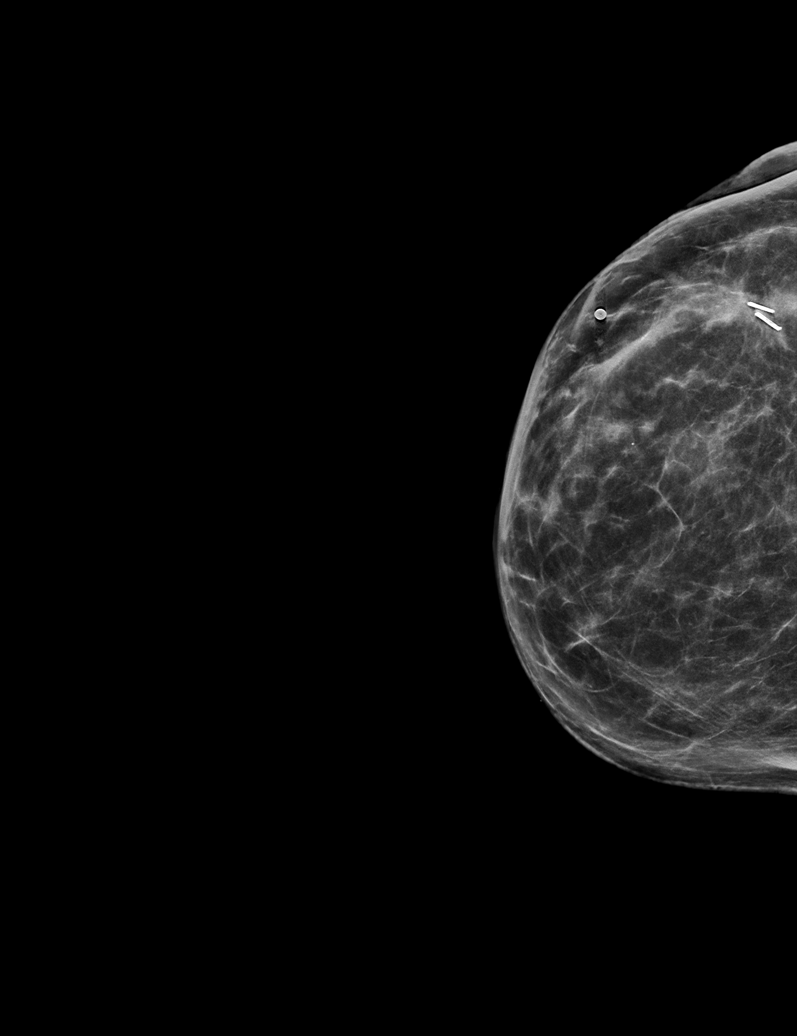

[R MLO synth-2D (1 of 2)]
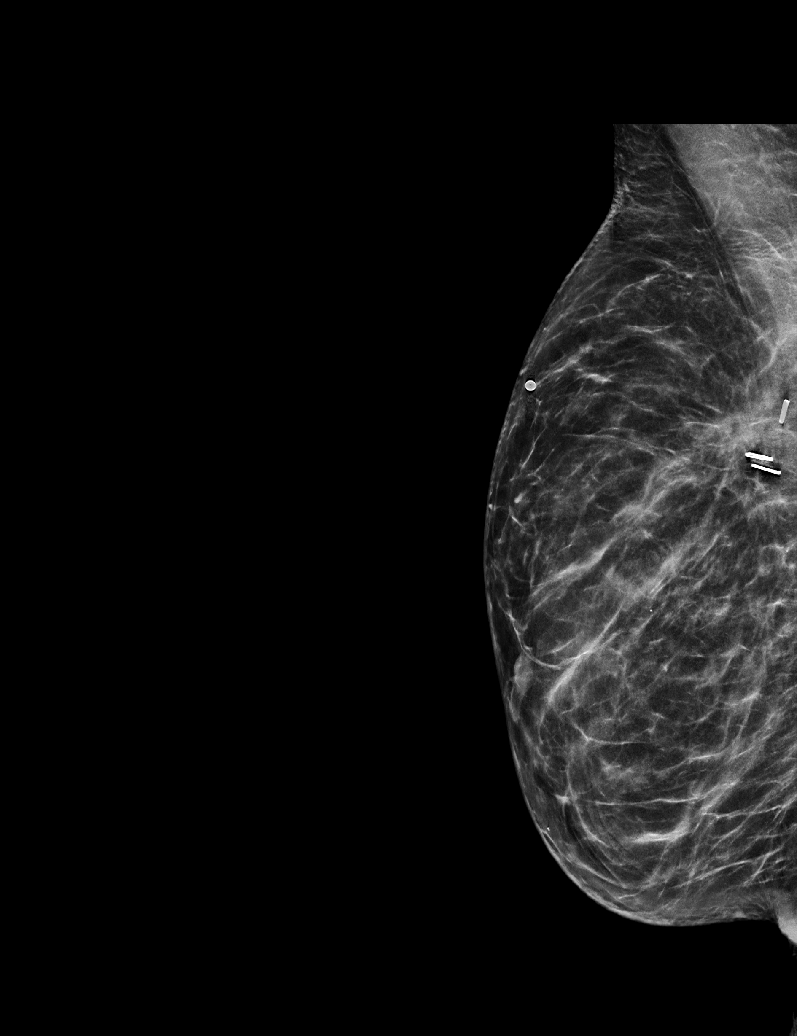

[R MLO synth-2D (2 of 2)]
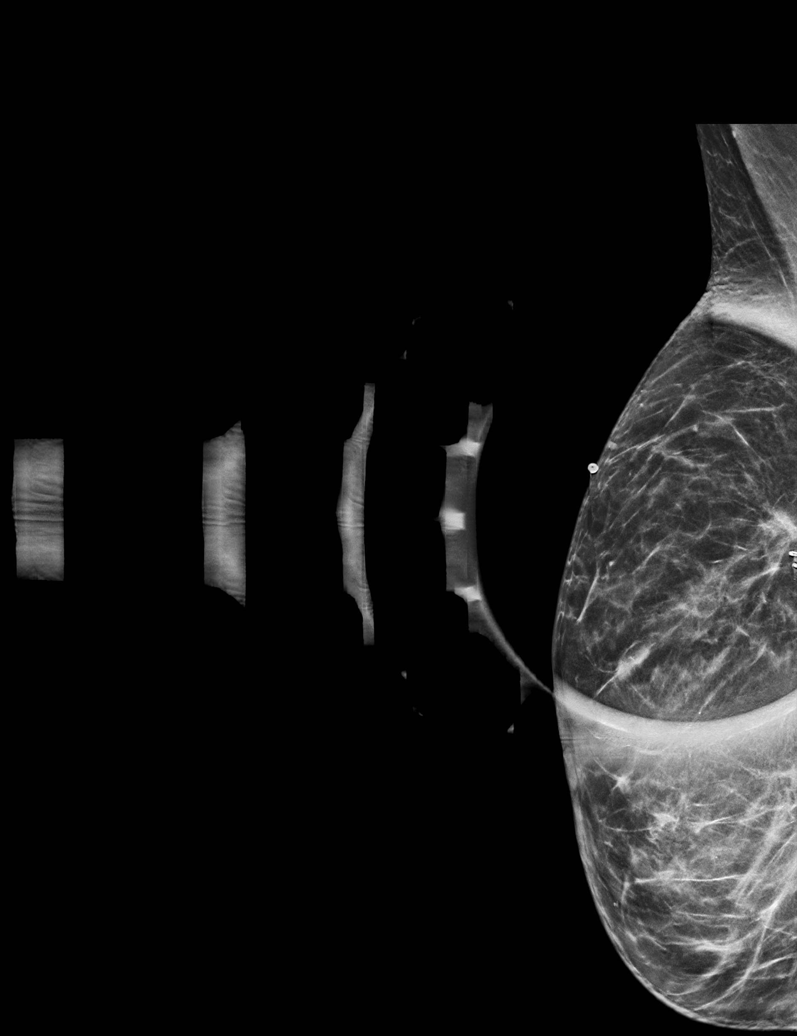

[R CC tomo · tomo slice 33/66.0]
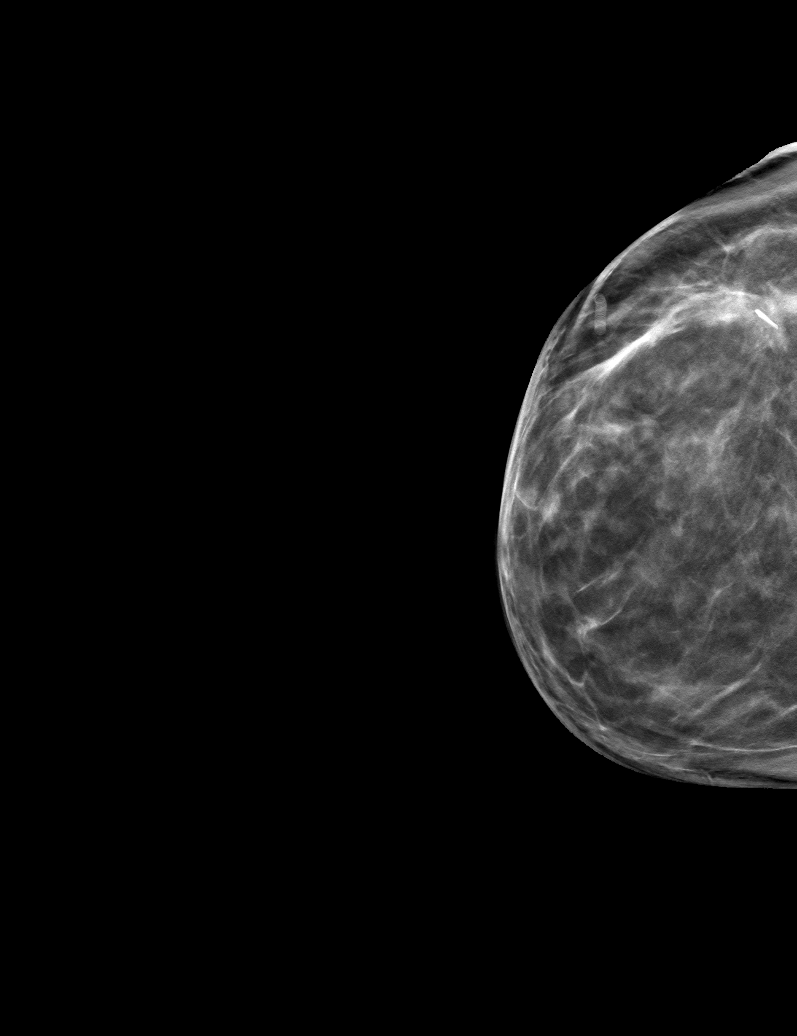

[R MLO tomo (1 of 2) · tomo slice 27/53.0]
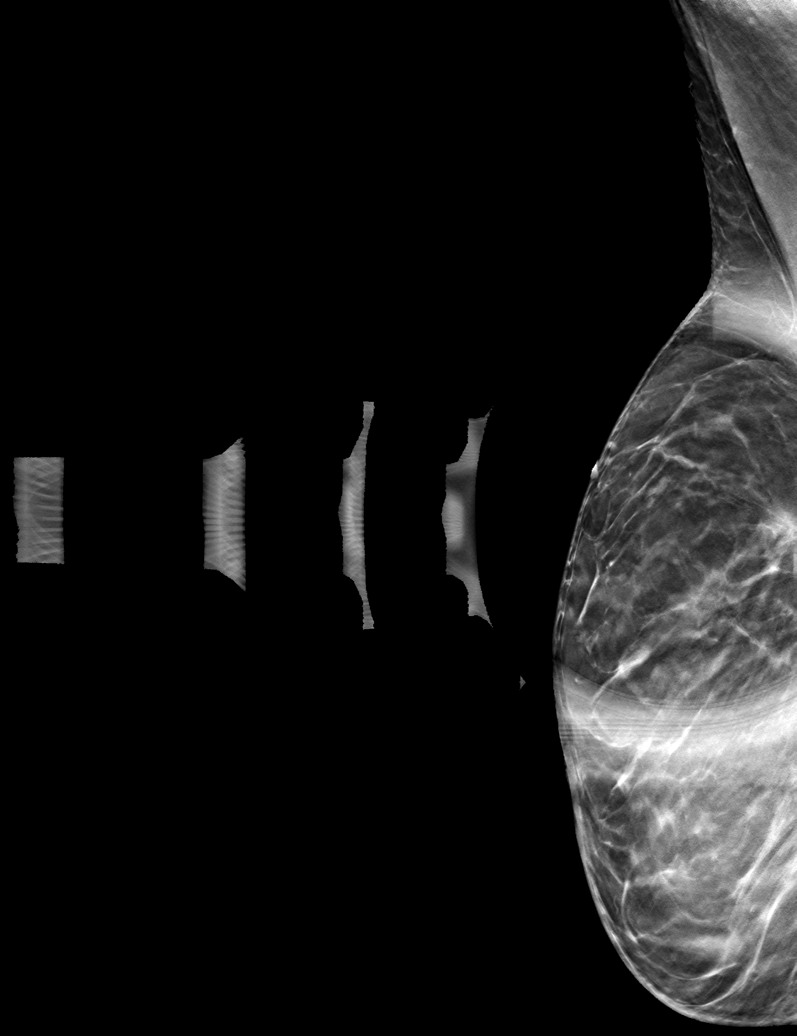

[R MLO tomo (2 of 2) · tomo slice 29/56.0]
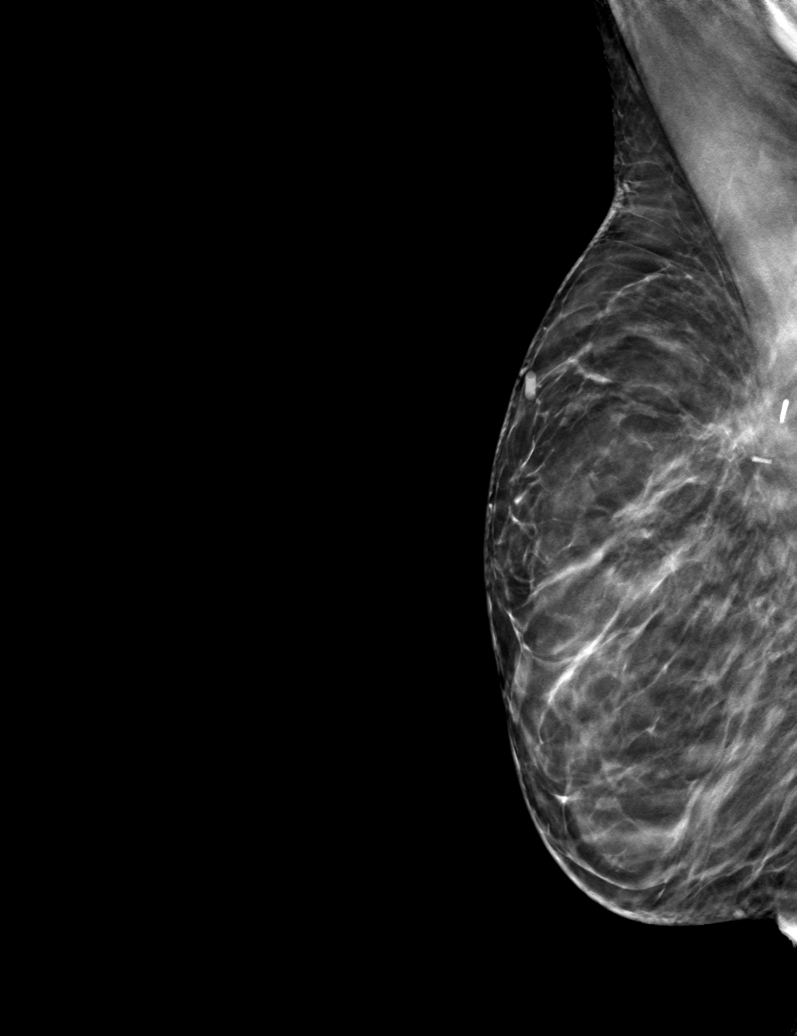

[6 of 18 positions shown; findings below may reference images not displayed]

ACR Breast Density Category b: There are scattered areas of
fibroglandular density.
FINDINGS: There are stable postsurgical changes within the outer RIGHT breast,
corresponding to the area of clinical concern with overlying skin
marker in place. There are no new dominant masses, suspicious
calcifications or secondary signs of malignancy within the RIGHT
breast.

Mammographic images were processed with CAD.

Targeted ultrasound is performed, evaluating the upper-outer
quadrant of the RIGHT breast with particular attention to the 10
o'clock axis as directed by the patient, showing only normal breast
tissues at the site of the palpable area of concern. No solid or
cystic mass. Expected postsurgical scar is seen nearby.
IMPRESSION: No evidence of malignancy within the RIGHT breast. Stable
postsurgical changes within the outer RIGHT breast.

RECOMMENDATION:
1. Annual mammograms. Next annual mammogram for the LEFT breast will
be due at the end Friday August, 2019.
2. The patient was instructed to return sooner if the area that she
feels becomes larger and/or firmer to palpation, or if a new
palpable abnormality is identified in either breast.

I have discussed the findings and recommendations with the patient.
If applicable, a reminder letter will be sent to the patient
regarding the next appointment.

BI-RADS CATEGORY  2: Benign.

## 2020-09-02 ENCOUNTER — Ambulatory Visit
Admission: RE | Admit: 2020-09-02 | Discharge: 2020-09-02 | Disposition: A | Discharge status: Home / Self Care | Payer: Medicare Other | Source: Ambulatory Visit | Attending: Nurse Practitioner | Admitting: Nurse Practitioner

## 2020-09-02 ENCOUNTER — Encounter: Payer: Medicare Other | Admitting: Nurse Practitioner

## 2020-09-02 ENCOUNTER — Other Ambulatory Visit: Payer: Self-pay

## 2020-09-02 DIAGNOSIS — Z9889 Other specified postprocedural states: Secondary | ICD-10-CM

## 2020-09-07 ENCOUNTER — Encounter: Payer: Self-pay | Admitting: Nurse Practitioner

## 2020-09-07 ENCOUNTER — Other Ambulatory Visit: Payer: Self-pay

## 2020-09-07 ENCOUNTER — Ambulatory Visit (INDEPENDENT_AMBULATORY_CARE_PROVIDER_SITE_OTHER): Payer: Medicare Other | Admitting: Nurse Practitioner

## 2020-09-07 VITALS — BP 118/74 | Ht 62.0 in | Wt 109.0 lb

## 2020-09-07 DIAGNOSIS — Z9289 Personal history of other medical treatment: Secondary | ICD-10-CM | POA: Diagnosis not present

## 2020-09-07 DIAGNOSIS — M810 Age-related osteoporosis without current pathological fracture: Secondary | ICD-10-CM

## 2020-09-07 DIAGNOSIS — Z853 Personal history of malignant neoplasm of breast: Secondary | ICD-10-CM

## 2020-09-07 DIAGNOSIS — N812 Incomplete uterovaginal prolapse: Secondary | ICD-10-CM

## 2020-09-07 DIAGNOSIS — Z01419 Encounter for gynecological examination (general) (routine) without abnormal findings: Secondary | ICD-10-CM | POA: Diagnosis not present

## 2020-09-07 MED ORDER — DENOSUMAB 60 MG/ML ~~LOC~~ SOSY
60.0000 mg | PREFILLED_SYRINGE | Freq: Once | SUBCUTANEOUS | Status: AC
  Administered 2020-09-07: 60 mg via SUBCUTANEOUS

## 2020-09-07 NOTE — Addendum Note (Signed)
Addended by: Nelva Nay on: 09/07/2020 09:35 AM   Modules accepted: Orders

## 2020-09-07 NOTE — Patient Instructions (Signed)
Health Maintenance After Age 79 After age 79, you are at a higher risk for certain long-term diseases and infections as well as injuries from falls. Falls are a major cause of broken bones and head injuries in people who are older than age 79. Getting regular preventive care can help to keep you healthy and well. Preventive care includes getting regular testing and making lifestyle changes as recommended by your health care provider. Talk with your health care provider about:  Which screenings and tests you should have. A screening is a test that checks for a disease when you have no symptoms.  A diet and exercise plan that is right for you. What should I know about screenings and tests to prevent falls? Screening and testing are the best ways to find a health problem early. Early diagnosis and treatment give you the best chance of managing medical conditions that are common after age 79. Certain conditions and lifestyle choices may make you more likely to have a fall. Your health care provider may recommend:  Regular vision checks. Poor vision and conditions such as cataracts can make you more likely to have a fall. If you wear glasses, make sure to get your prescription updated if your vision changes.  Medicine review. Work with your health care provider to regularly review all of the medicines you are taking, including over-the-counter medicines. Ask your health care provider about any side effects that may make you more likely to have a fall. Tell your health care provider if any medicines that you take make you feel dizzy or sleepy.  Osteoporosis screening. Osteoporosis is a condition that causes the bones to get weaker. This can make the bones weak and cause them to break more easily.  Blood pressure screening. Blood pressure changes and medicines to control blood pressure can make you feel dizzy.  Strength and balance checks. Your health care provider may recommend certain tests to check your  strength and balance while standing, walking, or changing positions.  Foot health exam. Foot pain and numbness, as well as not wearing proper footwear, can make you more likely to have a fall.  Depression screening. You may be more likely to have a fall if you have a fear of falling, feel emotionally low, or feel unable to do activities that you used to do.  Alcohol use screening. Using too much alcohol can affect your balance and may make you more likely to have a fall. What actions can I take to lower my risk of falls? General instructions  Talk with your health care provider about your risks for falling. Tell your health care provider if: ? You fall. Be sure to tell your health care provider about all falls, even ones that seem minor. ? You feel dizzy, sleepy, or off-balance.  Take over-the-counter and prescription medicines only as told by your health care provider. These include any supplements.  Eat a healthy diet and maintain a healthy weight. A healthy diet includes low-fat dairy products, low-fat (lean) meats, and fiber from whole grains, beans, and lots of fruits and vegetables. Home safety  Remove any tripping hazards, such as rugs, cords, and clutter.  Install safety equipment such as grab bars in bathrooms and safety rails on stairs.  Keep rooms and walkways well-lit. Activity  Follow a regular exercise program to stay fit. This will help you maintain your balance. Ask your health care provider what types of exercise are appropriate for you.  If you need a cane or walker,   use it as recommended by your health care provider.  Wear supportive shoes that have nonskid soles.   Lifestyle  Do not drink alcohol if your health care provider tells you not to drink.  If you drink alcohol, limit how much you have: ? 0-1 drink a day for women. ? 0-2 drinks a day for men.  Be aware of how much alcohol is in your drink. In the [place] one drink equals one typical bottle of beer (12  oz), one-half glass of wine (5 oz), or one shot of hard liquor (1 oz).  Do not use any products that contain nicotine or tobacco, such as cigarettes and e-cigarettes. If you need help quitting, ask your health care provider. Summary  Having a healthy lifestyle and getting preventive care can help to protect your health and wellness after age 79.  Screening and testing are the best way to find a health problem early and help you avoid having a fall. Early diagnosis and treatment give you the best chance for managing medical conditions that are more common for people who are older than age 79.  Falls are a major cause of broken bones and head injuries in people who are older than age 79. Take precautions to prevent a fall at home.  Work with your health care provider to learn what changes you can make to improve your health and wellness and to prevent falls. This information is not intended to replace advice given to you by your health care provider. Make sure you discuss any questions you have with your health care provider. Document Revised: 08/01/2018 Document Reviewed: 02/21/2017 Elsevier Patient Education  2021 Elsevier Inc.  

## 2020-09-07 NOTE — Progress Notes (Signed)
   Crystal Tate 12/21/1940 [id number]   History:  80 y.o. G2P2002 presents for breast and pelvic exam. Postmenopausal - no HRT, no bleeding. 2018 right breast cancer managed with lumpectomy, radiation, and antiestrogen therapy. Osteoporosis - on Prolia. History of squamous cell skin cancer.   Gynecologic History Patient's last menstrual period was 02/28/1999.   Contraception/Family planning: post menopausal status  Health Maintenance Last Pap: No longer screening per guidelines Last mammogram: 09/02/2020. Results were: normal Last colonoscopy: 2020. Results were: normal Last Dexa: 06/29/2020. Results were: T-score -3.1 of spine  Past medical history, past surgical history, family history and social history were all reviewed and documented in the EPIC chart. Widowed. Son lives here, daughter in [place].   ROS:  A ROS was performed and pertinent positives and negatives are included.  Exam:  Vitals:   09/07/20 0856  BP: 118/74  Weight: 109 lb (49.4 kg)  Height: 5\' 2"  (1.575 m)   Body mass index is 19.94 kg/m.  General appearance:  Normal Thyroid:  Symmetrical, normal in size, without palpable masses or nodularity. Respiratory  Auscultation:  Clear without wheezing or rhonchi Cardiovascular  Auscultation:  Regular rate, without rubs, murmurs or gallops  Edema/varicosities:  Not grossly evident Abdominal  Soft,nontender, without masses, guarding or rebound.  Liver/spleen:  No organomegaly noted  Hernia:  None appreciated  Skin  Inspection:  Grossly normal Breasts: Examined lying and sitting.   Right: Without masses, retractions, nipple discharge or axillary adenopathy.   Left: Without masses, retractions, nipple discharge or axillary adenopathy. Genitourinary   Inguinal/mons:  Normal without inguinal adenopathy  External genitalia:  Normal appearing vulva with no masses, tenderness, or lesions.  BUS/Urethra/Skene's glands:  Normal  Vagina:  Normal appearing with normal color  and discharge, no lesions. Atrophic changes, 1+ uterine prolapse  Cervix:  Normal appearing without discharge or lesions  Uterus:  Normal in size, shape and contour.  Midline and mobile, nontender  Adnexa/parametria:     Rt: Normal in size, without masses or tenderness.   Lt: Normal in size, without masses or tenderness.  Anus and perineum: Mild redness  Digital rectal exam: Normal sphincter tone without palpated masses or tenderness  Assessment/Plan:  80 y.o. [id number] for annual exam.   Well female exam with routine gynecological exam - Education provided on SBEs, importance of preventative screenings, current guidelines, high calcium diet, regular exercise, and multivitamin daily. Labs with PCP.   Age-related osteoporosis without current pathological fracture - on Prolia with improvement. 06/2020 T-score -3.1 of spine (-4.1 in 2014). [facility].  First degree uterine prolapse - asymptomatic  Personal history of breast cancer - 2018 right breast cancer managed with lumpectomy, radiation, and antiestrogen therapy.  Screening for cervical cancer - Normal Pap history.  No longer screening per guidelines.  Screening for breast cancer - Continue annual screenings.  Normal breast exam today.  Screening for colon cancer - 2020 colonoscopy. No longer screening per GI recommendations.   Return in 1 year for annual.    Tamela Gammon DNP, 9:09 AM 09/07/2020

## 2020-12-30 ENCOUNTER — Telehealth: Payer: Self-pay | Admitting: *Deleted

## 2020-12-30 NOTE — Telephone Encounter (Addendum)
Deductible N/A  OOP MAX $3600 ($358.39 MET)  Annual exam 09/07/2020  Calcium 9.0            Date 04/14/2020  Upcoming dental procedures no  Prior Authorization needed No per rep reference # [id number]  Pt estimated Cost $30    03/11/2021   Coverage Details:0% one dose, $30 admin fee

## 2020-12-30 NOTE — Telephone Encounter (Signed)
[place] 09/08/2020 NEXT INJECTION 03/11/2021

## 2021-02-08 ENCOUNTER — Other Ambulatory Visit: Payer: Self-pay

## 2021-02-08 ENCOUNTER — Encounter (INDEPENDENT_AMBULATORY_CARE_PROVIDER_SITE_OTHER): Payer: Medicare Other | Admitting: Ophthalmology

## 2021-02-08 DIAGNOSIS — H43813 Vitreous degeneration, bilateral: Secondary | ICD-10-CM | POA: Diagnosis not present

## 2021-02-08 DIAGNOSIS — D3131 Benign neoplasm of right choroid: Secondary | ICD-10-CM | POA: Diagnosis not present

## 2021-02-14 ENCOUNTER — Encounter: Payer: Medicare Other | Admitting: Nurse Practitioner

## 2021-03-07 NOTE — Progress Notes (Signed)
 Patient Care Team: Everly, Rebecca B, DO as PCP - General (Internal Medicine) Everly, Rebecca B, DO Gudena, Vinay, MD as Consulting Physician (Hematology and Oncology) Causey, Lindsey Cornetto, NP as Nurse Practitioner (Hematology and Oncology) Moody, John, MD as Consulting Physician (Radiation Oncology) Cornett, Thomas, MD as Consulting Physician (General Surgery)  DIAGNOSIS:    ICD-10-CM   1. Malignant neoplasm of upper-outer quadrant of right breast in female, estrogen receptor positive (HCC)  C50.411    Z17.0       SUMMARY OF ONCOLOGIC HISTORY: Oncology History  Malignant neoplasm of upper-outer quadrant of right breast in female, estrogen receptor positive (HCC)  08/09/2016 Initial Diagnosis   Right breast mass 11:00 position: 1.7 x 1.2 x 1.4 cm, no axillary lymph nodes; biopsy revealed invasive ductal carcinoma ER 100%, PR 100%, Ki-67 10%, HER-2 negative ratio 1.27, grade 1-2, T1c N0 stage IA clinical stage   09/18/2016 Surgery   Rt Lumpectomy: IDC grade 2, 1.7 cm, 0/1 LN Neg, Er 100%, PR 100%, Her 2 Neg, T1CN0 Stage 1A   10/09/2016 Oncotype testing   Oncotype score 10; risk of recurrence 7%   12/13/2016 - 01/10/2017 Radiation Therapy   Adjuvant radiation   12/2016 -  Anti-estrogen oral therapy   Anastrozole daily x 5 years     CHIEF COMPLIANT: Follow-up of right breast cancer on anastrozole therapy  INTERVAL HISTORY: Crystal Tate is a 80 y.o. with above-mentioned history of breast cancer treated with lumpectomy, radiation, and who is currently on anti-estrogen therapy with anastrozole. Mammogram on 09/02/2020 showed no evidence of malignancy in the right breast. She presents to the clinic today for annual follow-up.  She is tolerating anastrozole extremely well without any problems or concerns.  Denies any lumps or nodules in the breast.  Slight intermittent right breast discomfort  ALLERGIES:  is allergic to other.  MEDICATIONS:  Current Outpatient Medications   Medication Sig Dispense Refill   anastrozole (ARIMIDEX) 1 MG tablet Take 1 tablet (1 mg total) by mouth daily. 90 tablet 3   Biotin 1000 MCG tablet Take 5,000 mcg by mouth daily.     calcium citrate-vitamin D (CITRACAL+D) 315-200 MG-UNIT per tablet Take 1 tablet by mouth 2 (two) times daily.     denosumab (PROLIA) 60 MG/ML SOSY injection Inject 60 mg into the skin every 6 (six) months.     pravastatin (PRAVACHOL) 10 MG tablet Take 10 mg by mouth daily.     valACYclovir (VALTREX) 1000 MG tablet Take 1 tablet by mouth as needed.   0   No current facility-administered medications for this visit.    PHYSICAL EXAMINATION: ECOG PERFORMANCE STATUS: 1 - Symptomatic but completely ambulatory  Vitals:   03/08/21 0926  BP: 111/63  Pulse: 71  Resp: 18  Temp: 97.8 F (36.6 C)  SpO2: 99%   Filed Weights   03/08/21 0926  Weight: 111 lb 6.4 oz (50.5 kg)    BREAST: No palpable masses or nodules in either right or left breasts. No palpable axillary supraclavicular or infraclavicular adenopathy no breast tenderness or nipple discharge. (exam performed in the presence of a chaperone)  LABORATORY DATA:  I have reviewed the data as listed CMP Latest Ref Rng & Units 03/05/2020 09/01/2019 09/08/2016  Glucose 70 - 99 mg/dL 106(H) - 110(H)  BUN 8 - 23 mg/dL 13 - 9  Creatinine 0.44 - 1.00 mg/dL 0.81 - 0.79  Sodium 135 - 145 mmol/L 137 - 139  Potassium 3.5 - 5.1 mmol/L 4.7 - 4.5  Chloride   98 - 111 mmol/L 102 - 104  CO2 22 - 32 mmol/L 27 - 28  Calcium 8.9 - 10.3 mg/dL 9.1 9.7 9.3  Total Protein 6.5 - 8.1 g/dL 7.5 - 6.9  Total Bilirubin 0.3 - 1.2 mg/dL 0.5 - 0.5  Alkaline Phos 38 - 126 U/L 85 - 30(L)  AST 15 - 41 U/L 19 - 20  ALT 0 - 44 U/L 17 - 19    Lab Results  Component Value Date   WBC 4.3 03/05/2020   HGB 13.7 03/05/2020   HCT 41.1 03/05/2020   MCV 99.3 03/05/2020   PLT 343 03/05/2020   NEUTROABS 2.3 03/05/2020    ASSESSMENT & PLAN:  Malignant neoplasm of upper-outer quadrant of  right breast in female, estrogen receptor positive (HCC) 09/18/16: Rt Lumpectomy: IDC grade 2, 1.7 cm, 0/1 LN Neg, Er 100%, PR 100%, Her 2 Neg, T1CN0 Stage 1A Oncotype score 10, risk of recurrence 7%, low risk Adjuvant radiation therapy 12/13/2016-01/10/2017   Treatment plan: Adjuvant antiestrogen therapy with anastrozole 1 mg by mouth daily 5 years She will complete 5 years in September 2023.  After that she can discontinue.  Anastrozole toxicities: Denies any adverse effects to anastrozole therapy. Occasional hot flashes   Breast cancer surveillance: 1.  Breast exam 03/08/2021: Benign 2. Mammogram   09/02/2020:  Benign breast density category B 3.  Bone density 07/27/2020: T score -3.1: Osteoporosis: Currently on Prolia and calcium and vitamin D.  (Previously she was T score -3.5) She gets Prolia with her gynecologist.   Return to clinic in 1 year for follow-up  No orders of the defined types were placed in this encounter.  The patient has a good understanding of the overall plan. she agrees with it. she will call with any problems that may develop before the next visit here.  Total time spent: 20 mins including face to face time and time spent for planning, charting and coordination of care  Vinay K Gudena, MD, MPH 03/08/2021  I, Kirstyn Evans, am acting as scribe for Dr. Vinay Gudena.  I have reviewed the above documentation for accuracy and completeness, and I agree with the above.       

## 2021-03-08 ENCOUNTER — Inpatient Hospital Stay: Payer: Medicare Other | Attending: Hematology and Oncology | Admitting: Hematology and Oncology

## 2021-03-08 ENCOUNTER — Other Ambulatory Visit: Payer: Self-pay

## 2021-03-08 DIAGNOSIS — M810 Age-related osteoporosis without current pathological fracture: Secondary | ICD-10-CM | POA: Diagnosis not present

## 2021-03-08 DIAGNOSIS — Z79811 Long term (current) use of aromatase inhibitors: Secondary | ICD-10-CM | POA: Diagnosis not present

## 2021-03-08 DIAGNOSIS — Z170 Estrogen receptor positive status [ER+]: Secondary | ICD-10-CM | POA: Diagnosis not present

## 2021-03-08 DIAGNOSIS — C50411 Malignant neoplasm of upper-outer quadrant of right female breast: Secondary | ICD-10-CM | POA: Diagnosis present

## 2021-03-08 MED ORDER — ANASTROZOLE 1 MG PO TABS: 1.0000 mg | ORAL_TABLET | Freq: Every day | ORAL | 3 refills | Status: DC

## 2021-03-08 NOTE — Assessment & Plan Note (Signed)
09/18/16: Rt Lumpectomy: IDC grade 2, 1.7 cm, 0/1 LN Neg, Er 100%, PR 100%, Her 2 Neg, T1CN0 Stage 1A Oncotype score 10, risk of recurrence 7%, low risk Adjuvant radiation therapy 12/13/2016-01/10/2017  Treatment plan: Adjuvant antiestrogen therapy with anastrozole 1 mg by mouth daily 5 years  Anastrozoletoxicities:Denies any adverse effects to anastrozole therapy. Occasional hot flashes  Breast cancer surveillance: 1.Breast exam 03/08/2021: Benign 2.Mammogram  09/02/2020:  Benign breast density category B 3.Bone density 07/27/2020: T score -3.1: Osteoporosis: Currently on Prolia and calcium and vitamin D.  (Previously she was T score -3.5) She gets Prolia with her gynecologist.  Return to clinic in 1 year for follow-up

## 2021-03-11 ENCOUNTER — Ambulatory Visit (INDEPENDENT_AMBULATORY_CARE_PROVIDER_SITE_OTHER): Payer: Medicare Other | Admitting: *Deleted

## 2021-03-11 ENCOUNTER — Other Ambulatory Visit: Payer: Self-pay

## 2021-03-11 DIAGNOSIS — M810 Age-related osteoporosis without current pathological fracture: Secondary | ICD-10-CM | POA: Diagnosis not present

## 2021-03-11 MED ORDER — DENOSUMAB 60 MG/ML ~~LOC~~ SOSY
60.0000 mg | PREFILLED_SYRINGE | Freq: Once | SUBCUTANEOUS | Status: AC
  Administered 2021-03-11: 60 mg via SUBCUTANEOUS

## 2021-03-22 NOTE — Telephone Encounter (Signed)
Patient received prolia on 03-11-21. Summary of benefits scanned into Epic.   Encounter closed.

## 2021-05-02 ENCOUNTER — Other Ambulatory Visit: Payer: Self-pay | Admitting: Hematology and Oncology

## 2021-07-21 ENCOUNTER — Other Ambulatory Visit: Payer: Self-pay | Admitting: Nurse Practitioner

## 2021-07-21 DIAGNOSIS — Z1231 Encounter for screening mammogram for malignant neoplasm of breast: Secondary | ICD-10-CM

## 2021-08-05 ENCOUNTER — Telehealth: Payer: Self-pay | Admitting: Hematology and Oncology

## 2021-08-05 NOTE — Telephone Encounter (Signed)
Per provider reschedule called and spoke to pt about appointment.  Pt confirmed appointment  ?

## 2021-08-18 ENCOUNTER — Telehealth: Payer: Self-pay | Admitting: *Deleted

## 2021-08-18 NOTE — Telephone Encounter (Addendum)
Deductible n/a  OOP MAX $3600 ($290 met)  Annual exam 09/13/2021 upcoming  Calcium 9.8            Date 02/22/2021  Upcoming dental procedures NO  Prior Authorization needed NO per reference # [id number] Crystal Tate d 5/3/202311:46   Pt estimated Cost $296   Prolia same day as appt 09/13/2021    Coverage Details:20% of one dose, $20 admin fee

## 2021-09-05 ENCOUNTER — Ambulatory Visit
Admission: RE | Admit: 2021-09-05 | Discharge: 2021-09-05 | Disposition: A | Discharge status: Home / Self Care | Payer: Medicare Other | Source: Ambulatory Visit | Attending: Nurse Practitioner | Admitting: Nurse Practitioner

## 2021-09-05 DIAGNOSIS — Z1231 Encounter for screening mammogram for malignant neoplasm of breast: Secondary | ICD-10-CM

## 2021-09-08 ENCOUNTER — Ambulatory Visit: Payer: Medicare Other | Admitting: Nurse Practitioner

## 2021-09-12 NOTE — Progress Notes (Unsigned)
   Crystal Tate 1940/08/21 [id number]   History:  81 y.o. G2P2002 presents for breast and pelvic exam. Postmenopausal - no HRT, no bleeding. 2018 right breast cancer managed with lumpectomy, radiation, and antiestrogen therapy. Osteoporosis - on Prolia. History of squamous cell skin cancer.   Gynecologic History Patient's last menstrual period was 02/28/1999.   Contraception/Family planning: post menopausal status Sexually active: No  Health Maintenance Last Pap: 04/01/2013. Results were: Normal Last mammogram: 09/05/2021. Results were: Normal Last colonoscopy: 2020. Results were: Normal, screenings no longer recommended Last Dexa: 06/29/2020. Results were: T-score -3.1 of spine  Past medical history, past surgical history, family history and social history were all reviewed and documented in the EPIC chart. Widowed. Son lives here, daughter in [place].   ROS:  A ROS was performed and pertinent positives and negatives are included.  Exam:  There were no vitals filed for this visit.  There is no height or weight on file to calculate BMI.  General appearance:  Normal Thyroid:  Symmetrical, normal in size, without palpable masses or nodularity. Respiratory  Auscultation:  Clear without wheezing or rhonchi Cardiovascular  Auscultation:  Regular rate, without rubs, murmurs or gallops  Edema/varicosities:  Not grossly evident Abdominal  Soft,nontender, without masses, guarding or rebound.  Liver/spleen:  No organomegaly noted  Hernia:  None appreciated  Skin  Inspection:  Grossly normal Breasts: Examined lying and sitting.   Right: Without masses, retractions, nipple discharge or axillary adenopathy.   Left: Without masses, retractions, nipple discharge or axillary adenopathy. Genitourinary   Inguinal/mons:  Normal without inguinal adenopathy  External genitalia:  Normal appearing vulva with no masses, tenderness, or lesions.  BUS/Urethra/Skene's glands:  Normal  Vagina:  Normal  appearing with normal color and discharge, no lesions. Atrophic changes, 1+ uterine prolapse  Cervix:  Normal appearing without discharge or lesions  Uterus:  Normal in size, shape and contour.  Midline and mobile, nontender  Adnexa/parametria:     Rt: Normal in size, without masses or tenderness.   Lt: Normal in size, without masses or tenderness.  Anus and perineum: Mild redness  Digital rectal exam: Normal sphincter tone without palpated masses or tenderness  Patient informed chaperone available to be present for breast and pelvic exam. Patient has requested no chaperone to be present. Patient has been advised what will be completed during breast and pelvic exam.   Assessment/Plan:  81 y.o. [id number] for annual exam.   Well female exam with routine gynecological exam - Education provided on SBEs, importance of preventative screenings, current guidelines, high calcium diet, regular exercise, and multivitamin daily. Labs with PCP.   Age-related osteoporosis without current pathological fracture - on Prolia with improvement. 06/2020 T-score -3.1 of spine (-4.1 in 2014). Wal[facility].  Personal history of breast cancer - 2018 right breast cancer managed with lumpectomy, radiation, and antiestrogen therapy.  Screening for cervical cancer - Normal Pap history.  No longer screening per guidelines.  Screening for breast cancer - Continue annual screenings.  Normal breast exam today.  Screening for colon cancer - 2020 colonoscopy. No longer screening per GI recommendations.   Return in 1 year for annual.    Tamela Gammon DNP, 9:46 AM 09/12/2021

## 2021-09-13 ENCOUNTER — Encounter: Payer: Self-pay | Admitting: Nurse Practitioner

## 2021-09-13 ENCOUNTER — Ambulatory Visit (INDEPENDENT_AMBULATORY_CARE_PROVIDER_SITE_OTHER): Payer: Medicare Other | Admitting: Nurse Practitioner

## 2021-09-13 VITALS — BP 118/74 | Ht 62.0 in | Wt 110.0 lb

## 2021-09-13 DIAGNOSIS — Z01419 Encounter for gynecological examination (general) (routine) without abnormal findings: Secondary | ICD-10-CM

## 2021-09-13 DIAGNOSIS — Z9189 Other specified personal risk factors, not elsewhere classified: Secondary | ICD-10-CM

## 2021-09-13 DIAGNOSIS — M810 Age-related osteoporosis without current pathological fracture: Secondary | ICD-10-CM | POA: Diagnosis not present

## 2021-09-13 DIAGNOSIS — Z9289 Personal history of other medical treatment: Secondary | ICD-10-CM | POA: Diagnosis not present

## 2021-09-13 DIAGNOSIS — Z853 Personal history of malignant neoplasm of breast: Secondary | ICD-10-CM

## 2021-09-13 DIAGNOSIS — Z780 Asymptomatic menopausal state: Secondary | ICD-10-CM

## 2021-09-13 MED ORDER — DENOSUMAB 60 MG/ML ~~LOC~~ SOSY
60.0000 mg | PREFILLED_SYRINGE | Freq: Once | SUBCUTANEOUS | Status: AC
  Administered 2021-09-13: 60 mg via SUBCUTANEOUS

## 2021-09-13 NOTE — Addendum Note (Signed)
Addended by: Nelva Nay on: 09/13/2021 10:14 AM   Modules accepted: Orders

## 2022-02-09 ENCOUNTER — Encounter (INDEPENDENT_AMBULATORY_CARE_PROVIDER_SITE_OTHER): Payer: Medicare Other | Admitting: Ophthalmology

## 2022-02-09 DIAGNOSIS — H353131 Nonexudative age-related macular degeneration, bilateral, early dry stage: Secondary | ICD-10-CM | POA: Diagnosis not present

## 2022-02-09 DIAGNOSIS — D3131 Benign neoplasm of right choroid: Secondary | ICD-10-CM | POA: Diagnosis not present

## 2022-02-09 DIAGNOSIS — H43813 Vitreous degeneration, bilateral: Secondary | ICD-10-CM | POA: Diagnosis not present

## 2022-02-15 ENCOUNTER — Telehealth: Payer: Self-pay | Admitting: *Deleted

## 2022-02-15 NOTE — Telephone Encounter (Addendum)
Deductible n/a  OOP MAX $3600 ($820.39)  Annual exam 09/13/21  Calcium  9.8           Date 02/22/2021  Upcoming dental procedures NO  Prior Authorization needed approved [id number] valid 02/15/2022-02/16/2023  Pt estimated Cost $296   Appt 11/27/223    Coverage Details:20% one dose, $20 admin fee

## 2022-03-05 NOTE — Progress Notes (Signed)
 Patient Care Team: Everly, Rebecca B, DO as PCP - General (Internal Medicine) Everly, Rebecca B, DO Gudena, Vinay, MD as Consulting Physician (Hematology and Oncology) Causey, Lindsey Cornetto, NP as Nurse Practitioner (Hematology and Oncology) Moody, John, MD as Consulting Physician (Radiation Oncology) Cornett, Thomas, MD as Consulting Physician (General Surgery)  DIAGNOSIS:  Encounter Diagnosis  Name Primary?   Malignant neoplasm of upper-outer quadrant of right breast in female, estrogen receptor positive (HCC) Yes    SUMMARY OF ONCOLOGIC HISTORY: Oncology History  Malignant neoplasm of upper-outer quadrant of right breast in female, estrogen receptor positive (HCC)  08/09/2016 Initial Diagnosis   Right breast mass 11:00 position: 1.7 x 1.2 x 1.4 cm, no axillary lymph nodes; biopsy revealed invasive ductal carcinoma ER 100%, PR 100%, Ki-67 10%, HER-2 negative ratio 1.27, grade 1-2, T1c N0 stage IA clinical stage   09/18/2016 Surgery   Rt Lumpectomy: IDC grade 2, 1.7 cm, 0/1 LN Neg, Er 100%, PR 100%, Her 2 Neg, T1CN0 Stage 1A   10/09/2016 Oncotype testing   Oncotype score 10; risk of recurrence 7%   12/13/2016 - 01/10/2017 Radiation Therapy   Adjuvant radiation   12/2016 -  Anti-estrogen oral therapy   Anastrozole daily x 5 years     CHIEF COMPLIANT:  Follow-up of right breast cancer on anastrozole therapy   INTERVAL HISTORY: Crystal Tate is a 81 y.o. with above-mentioned history of breast cancer treated with lumpectomy, radiation, and who is currently on anti-estrogen therapy with anastrozole.She presents to the clinic for a follow-up. She states that she does get some mild hot flashes in the evenings. She denies muscle pain and joint stiffness. Overall she did tolerate the anastrozole extremely well.    ALLERGIES:  is allergic to doxycycline and other.  MEDICATIONS:  Current Outpatient Medications  Medication Sig Dispense Refill   anastrozole (ARIMIDEX) 1 MG tablet TAKE  1 TABLET(1 MG) BY MOUTH DAILY 90 tablet 3   Biotin 1000 MCG tablet Take 5,000 mcg by mouth daily.     calcium citrate-vitamin D (CITRACAL+D) 315-200 MG-UNIT per tablet Take 1 tablet by mouth 2 (two) times daily.     denosumab (PROLIA) 60 MG/ML SOSY injection Inject 60 mg into the skin every 6 (six) months.     famotidine (PEPCID) 20 MG tablet Take by mouth.     pravastatin (PRAVACHOL) 10 MG tablet Take 10 mg by mouth daily.     valACYclovir (VALTREX) 1000 MG tablet Take 1 tablet by mouth as needed.   0   No current facility-administered medications for this visit.    PHYSICAL EXAMINATION: ECOG PERFORMANCE STATUS: 1 - Symptomatic but completely ambulatory  Vitals:   03/10/22 1019  BP: (!) 112/53  Pulse: 84  Resp: 18  Temp: 97.6 F (36.4 C)  SpO2: 96%   Filed Weights   03/10/22 1019  Weight: 115 lb 8 oz (52.4 kg)    BREAST: No palpable masses or nodules in either right or left breasts. No palpable axillary supraclavicular or infraclavicular adenopathy no breast tenderness or nipple discharge. (exam performed in the presence of a chaperone)  LABORATORY DATA:  I have reviewed the data as listed    Latest Ref Rng & Units 03/05/2020    8:22 AM 09/01/2019   11:53 AM 09/08/2016    8:45 AM  CMP  Glucose 70 - 99 mg/dL 106   110   BUN 8 - 23 mg/dL 13   9   Creatinine 0.44 - 1.00 mg/dL 0.81     0.79   Sodium 135 - 145 mmol/L 137   139   Potassium 3.5 - 5.1 mmol/L 4.7   4.5   Chloride 98 - 111 mmol/L 102   104   CO2 22 - 32 mmol/L 27   28   Calcium 8.9 - 10.3 mg/dL 9.1  9.7  9.3   Total Protein 6.5 - 8.1 g/dL 7.5   6.9   Total Bilirubin 0.3 - 1.2 mg/dL 0.5   0.5   Alkaline Phos 38 - 126 U/L 85   30   AST 15 - 41 U/L 19   20   ALT 0 - 44 U/L 17   19     Lab Results  Component Value Date   WBC 4.3 03/05/2020   HGB 13.7 03/05/2020   HCT 41.1 03/05/2020   MCV 99.3 03/05/2020   PLT 343 03/05/2020   NEUTROABS 2.3 03/05/2020    ASSESSMENT & PLAN:  Malignant neoplasm of  upper-outer quadrant of right breast in female, estrogen receptor positive ([place]) 09/18/16: Rt Lumpectomy: IDC grade 2, 1.7 cm, 0/1 LN Neg, Er 100%, PR 100%, Her 2 Neg, T1CN0 Stage 1A Oncotype score 10, risk of recurrence 7%, low risk Adjuvant radiation therapy 12/13/2016-01/10/2017   Treatment plan: Adjuvant antiestrogen therapy with anastrozole 1 mg by mouth daily 5 years She will complete 5 years in September 2023.  I recommended discontinuation of antiestrogen therapy at this time.   Anastrozole toxicities: Denies any adverse effects to anastrozole therapy. Occasional hot flashes   Breast cancer surveillance: 1.  Breast exam 03/10/2022: Benign 2. Mammogram 09/06/2021:  Benign breast density category B 3.  Bone density 07/27/2020: T score -3.1: Osteoporosis: Currently on Prolia and calcium and vitamin D.  (Previously she was T score -3.5) She gets Prolia with her gynecologist.   Return to clinic on an as-needed basis    No orders of the defined types were placed in this encounter.  The patient has a good understanding of the overall plan. she agrees with it. she will call with any problems that may develop before the next visit here. Total time spent: 30 mins including face to face time and time spent for planning, charting and co-ordination of care   Harriette Ohara, MD 03/10/22    I Gardiner Coins am scribing for Dr. Lindi Adie  I have reviewed the above documentation for accuracy and completeness, and I agree with the above.

## 2022-03-08 ENCOUNTER — Ambulatory Visit: Payer: Medicare Other | Admitting: Hematology and Oncology

## 2022-03-10 ENCOUNTER — Inpatient Hospital Stay: Payer: Medicare Other | Attending: Hematology and Oncology | Admitting: Hematology and Oncology

## 2022-03-10 ENCOUNTER — Other Ambulatory Visit: Payer: Self-pay

## 2022-03-10 VITALS — BP 112/53 | HR 84 | Temp 97.6°F | Resp 18 | Ht 62.0 in | Wt 115.5 lb

## 2022-03-10 DIAGNOSIS — C50411 Malignant neoplasm of upper-outer quadrant of right female breast: Secondary | ICD-10-CM | POA: Insufficient documentation

## 2022-03-10 DIAGNOSIS — Z79811 Long term (current) use of aromatase inhibitors: Secondary | ICD-10-CM | POA: Diagnosis not present

## 2022-03-10 DIAGNOSIS — Z170 Estrogen receptor positive status [ER+]: Secondary | ICD-10-CM | POA: Insufficient documentation

## 2022-03-10 NOTE — Assessment & Plan Note (Addendum)
09/18/16: Rt Lumpectomy: IDC grade 2, 1.7 cm, 0/1 LN Neg, Er 100%, PR 100%, Her 2 Neg, T1CN0 Stage 1A Oncotype score 10, risk of recurrence 7%, low risk Adjuvant radiation therapy 12/13/2016-01/10/2017   Treatment plan: Adjuvant antiestrogen therapy with anastrozole 1 mg by mouth daily 5 years She will complete 5 years in September 2023.  I recommended discontinuation of antiestrogen therapy at this time.   Anastrozole toxicities: Denies any adverse effects to anastrozole therapy. Occasional hot flashes   Breast cancer surveillance: 1.  Breast exam 03/10/2022: Benign 2. Mammogram 09/06/2021:  Benign breast density category B 3.  Bone density 07/27/2020: T score -3.1: Osteoporosis: Currently on Prolia and calcium and vitamin D.  (Previously she was T score -3.5) She gets Prolia with her gynecologist.   Return to clinic on an as-needed basis

## 2022-03-20 ENCOUNTER — Ambulatory Visit (INDEPENDENT_AMBULATORY_CARE_PROVIDER_SITE_OTHER): Payer: Medicare Other | Admitting: *Deleted

## 2022-03-20 DIAGNOSIS — M810 Age-related osteoporosis without current pathological fracture: Secondary | ICD-10-CM | POA: Diagnosis not present

## 2022-03-20 MED ORDER — DENOSUMAB 60 MG/ML ~~LOC~~ SOSY
60.0000 mg | PREFILLED_SYRINGE | Freq: Once | SUBCUTANEOUS | Status: AC
  Administered 2022-03-20: 60 mg via SUBCUTANEOUS

## 2022-07-25 ENCOUNTER — Other Ambulatory Visit: Payer: Self-pay | Admitting: Nurse Practitioner

## 2022-07-25 DIAGNOSIS — Z1231 Encounter for screening mammogram for malignant neoplasm of breast: Secondary | ICD-10-CM

## 2022-09-05 ENCOUNTER — Telehealth: Payer: Self-pay

## 2022-09-05 DIAGNOSIS — M810 Age-related osteoporosis without current pathological fracture: Secondary | ICD-10-CM

## 2022-09-05 NOTE — Telephone Encounter (Signed)
Pt LVM in triage line on 09/04/2022 inquiring as to when her next prolia injection is due. Last received on 03/20/2022.   Will route to prolia coordinator for her to review when she returns to office.

## 2022-09-07 ENCOUNTER — Ambulatory Visit
Admission: RE | Admit: 2022-09-07 | Discharge: 2022-09-07 | Disposition: A | Discharge status: Home / Self Care | Payer: Medicare Other | Source: Ambulatory Visit | Attending: Nurse Practitioner | Admitting: Nurse Practitioner

## 2022-09-07 DIAGNOSIS — Z1231 Encounter for screening mammogram for malignant neoplasm of breast: Secondary | ICD-10-CM

## 2022-09-07 NOTE — Telephone Encounter (Signed)
Insurance information sent to [employer]. Will await summary of benefits.

## 2022-09-12 NOTE — Telephone Encounter (Signed)
Deductible:  No  OOP MAX: $3,600 ($145 met)   Annual exam: 09-13-21 TW  Calcium:       9.7     Date: 05/05/2022  Upcoming dental procedures: No   Hx of Kidney Disease: No   Last Bone Density Scan: 06/29/20, patient advised to schedule  Is Prior Authorization needed: yes, on file through 02-16-2023, Auth #: [id number]  Pt estimated Cost: ~$310.64   Coverage Details: Prolia will be subject to 20% coinsurance up to a $3,600 OOP max ($165 met). Once met, coverage increases to 100%. Administration is subjecto to $20 copay, which does contribute to OOP maz. No deductible applies.

## 2022-09-12 NOTE — Telephone Encounter (Signed)
Routing to provider to advise on BMD prior to receiving prolia. Last BMD was 06/29/20.

## 2022-09-12 NOTE — Telephone Encounter (Signed)
OK to proceed with Prolia but have her schedule DXA please.

## 2022-09-12 NOTE — Telephone Encounter (Signed)
Call to patient. Patient states she is [place] and will not be back to [facility] to receive Prolia injection until after September 29th. Patient declined earlier appointments, scheduled for 01-28-23 at 0945. Patient agreeable to date and time of appointment. Advised would need to schedule BMD as well. Patient agreeable.   Routing to provider and will close encounter.

## 2022-09-19 ENCOUNTER — Ambulatory Visit: Payer: Medicare Other | Admitting: Nurse Practitioner

## 2023-01-18 ENCOUNTER — Telehealth: Payer: Self-pay | Admitting: Obstetrics and Gynecology

## 2023-01-18 MED ORDER — DENOSUMAB 60 MG/ML ~~LOC~~ SOSY
60.0000 mg | PREFILLED_SYRINGE | Freq: Once | SUBCUTANEOUS | Status: AC
  Administered 2023-02-08: 60 mg via SUBCUTANEOUS

## 2023-01-18 MED ORDER — DENOSUMAB 60 MG/ML ~~LOC~~ SOSY: 60.0000 mg | PREFILLED_SYRINGE | Freq: Once | SUBCUTANEOUS | Status: DC

## 2023-01-18 NOTE — Addendum Note (Signed)
Addended by: Arnold Long T on: 01/18/2023 09:15 AM   Modules accepted: Orders

## 2023-01-18 NOTE — Telephone Encounter (Signed)
Call to patient since last spoke to in May. No insurance changes or medical changes. Estimated OOP reviewed with patient and she verbalized understanding.   Summary of benefits and PA scanned into Epic.   Encounter closed.

## 2023-01-18 NOTE — Telephone Encounter (Signed)
Patient is scheduled to receive Prolia in October.  She is overdue for her annual exam and needs to have this done.  Appointment was cancelled for earlier this year.  Please see if it is possible to do her annual exam and the Prolia on the same date.

## 2023-01-22 NOTE — Telephone Encounter (Signed)
Sent message to front to schedule AEX

## 2023-01-29 NOTE — Telephone Encounter (Signed)
Spoke with patient. Advised per Dr. Edward Jolly. Patient request Prolia and exam same day.   Prolia and Breast & Pelvic exam scheduled for 02/08/20 at 11am with TW. Patient declined earlier visits.   Routing to provider for final review. Patient is agreeable to disposition. Will close encounter.  Cc: Daylene Katayama

## 2023-01-30 ENCOUNTER — Ambulatory Visit: Payer: Medicare Other

## 2023-02-08 ENCOUNTER — Encounter: Payer: Self-pay | Admitting: Nurse Practitioner

## 2023-02-08 ENCOUNTER — Ambulatory Visit (INDEPENDENT_AMBULATORY_CARE_PROVIDER_SITE_OTHER): Payer: Medicare Other | Admitting: Nurse Practitioner

## 2023-02-08 VITALS — BP 104/72 | HR 63 | Ht 62.0 in | Wt 118.0 lb

## 2023-02-08 DIAGNOSIS — Z780 Asymptomatic menopausal state: Secondary | ICD-10-CM

## 2023-02-08 DIAGNOSIS — Z9189 Other specified personal risk factors, not elsewhere classified: Secondary | ICD-10-CM | POA: Diagnosis not present

## 2023-02-08 DIAGNOSIS — M810 Age-related osteoporosis without current pathological fracture: Secondary | ICD-10-CM

## 2023-02-08 DIAGNOSIS — Z853 Personal history of malignant neoplasm of breast: Secondary | ICD-10-CM | POA: Diagnosis not present

## 2023-02-08 DIAGNOSIS — Z01419 Encounter for gynecological examination (general) (routine) without abnormal findings: Secondary | ICD-10-CM

## 2023-02-08 NOTE — Progress Notes (Signed)
Crystal Tate 12/13/1940 [id number]   History:  82 y.o. G2P2002 presents for breast and pelvic exam. Postmenopausal - no HRT, no bleeding. 2018 right breast cancer managed with lumpectomy, radiation, and antiestrogen therapy (completed 5-year course of Anastrozole last year). Osteoporosis - on Prolia. History of squamous cell skin cancer.   Gynecologic History Patient's last menstrual period was 02/28/1999.   Contraception/Family planning: post menopausal status Sexually active: No  Health Maintenance Last Pap: 04/01/2013. Results were: Normal Last mammogram: 09/07/2022. Results were: Normal Last colonoscopy: 2020. Results were: Normal, screenings no longer recommended Last Dexa: 06/29/2020. Results were: T-score -3.1 of spine  Past medical history, past surgical history, family history and social history were all reviewed and documented in the EPIC chart. Widowed. Son lives here - married, 2 children. Daughter in [place], has 2 children. She spends the summer in [place].   ROS:  A ROS was performed and pertinent positives and negatives are included.  Exam:  Vitals:   02/08/23 1114  BP: 104/72  Pulse: 63  SpO2: 98%  Weight: 118 lb (53.5 kg)  Height: 5\' 2"  (1.575 m)     Body mass index is 21.58 kg/m.  General appearance:  Normal Thyroid:  Symmetrical, normal in size, without palpable masses or nodularity. Respiratory  Auscultation:  Clear without wheezing or rhonchi Cardiovascular  Auscultation:  Regular rate, without rubs, murmurs or gallops  Edema/varicosities:  Not grossly evident Abdominal  Soft,nontender, without masses, guarding or rebound.  Liver/spleen:  No organomegaly noted  Hernia:  None appreciated  Skin  Inspection:  Grossly normal Breasts: Examined lying and sitting.   Right: Without masses, retractions, nipple discharge or axillary adenopathy.   Left: Without masses, retractions, nipple discharge or axillary adenopathy. Pelvic: External genitalia:  no  lesions              Urethra:  normal appearing urethra with no masses, tenderness or lesions              Bartholins and Skenes: normal                 Vagina: normal appearing vagina with normal color and discharge, no lesions. Atrophic changes              Cervix: no lesions Bimanual Exam:  Uterus:  no masses or tenderness              Adnexa: no mass, fullness, tenderness              Rectovaginal: Deferred              Anus:  normal, no lesions  Patient informed chaperone available to be present for breast and pelvic exam. Patient has requested no chaperone to be present. Patient has been advised what will be completed during breast and pelvic exam.   Assessment/Plan:  82 y.o. G2P2002 for annual exam.   Encounter for breast and pelvic examination - Education provided on SBEs, importance of preventative screenings, current guidelines, high calcium diet, regular exercise, and multivitamin daily. Labs with PCP.   Age-related osteoporosis without current pathological fracture - Plan: DG Bone Density. On Prolia with improvement. 06/2020 T-score -3.1 of spine (-4.1 in 2014). Wal[facility]. Received Prolia today. Will schedule DXA at [facility].   Postmenopausal - no HRT, no bleeding  History of breast cancer - 2018 right breast cancer managed with lumpectomy, radiation, and antiestrogen therapy (completed course of Anastrozole  last year). Followed by oncology. Continue annual screenings.  Normal breast exam today.  Screening for cervical cancer - Normal Pap history.  No longer screening per guidelines.  Screening for colon cancer - 2020 colonoscopy. No longer screening per GI recommendations.   Return in 1 year for breast and pelvic exam.     Olivia Mackie DNP, 12:01 PM 02/08/2023

## 2023-02-13 ENCOUNTER — Other Ambulatory Visit: Payer: Self-pay | Admitting: Nurse Practitioner

## 2023-02-13 DIAGNOSIS — M810 Age-related osteoporosis without current pathological fracture: Secondary | ICD-10-CM

## 2023-02-15 ENCOUNTER — Encounter (INDEPENDENT_AMBULATORY_CARE_PROVIDER_SITE_OTHER): Payer: Medicare Other | Admitting: Ophthalmology

## 2023-02-15 DIAGNOSIS — D3131 Benign neoplasm of right choroid: Secondary | ICD-10-CM | POA: Diagnosis not present

## 2023-02-15 DIAGNOSIS — H43813 Vitreous degeneration, bilateral: Secondary | ICD-10-CM

## 2023-02-15 DIAGNOSIS — H2513 Age-related nuclear cataract, bilateral: Secondary | ICD-10-CM | POA: Diagnosis not present

## 2023-02-15 DIAGNOSIS — H353121 Nonexudative age-related macular degeneration, left eye, early dry stage: Secondary | ICD-10-CM

## 2023-02-20 ENCOUNTER — Encounter: Payer: Medicare Other | Admitting: Obstetrics and Gynecology

## 2023-07-19 ENCOUNTER — Telehealth: Payer: Self-pay | Admitting: *Deleted

## 2023-07-19 DIAGNOSIS — M810 Age-related osteoporosis without current pathological fracture: Secondary | ICD-10-CM

## 2023-07-19 NOTE — Telephone Encounter (Signed)
Insurance information submitted to [employer] portal. Will await summary of benefits for prolia.    

## 2023-07-31 ENCOUNTER — Other Ambulatory Visit: Payer: Self-pay | Admitting: Nurse Practitioner

## 2023-07-31 DIAGNOSIS — Z1231 Encounter for screening mammogram for malignant neoplasm of breast: Secondary | ICD-10-CM

## 2023-08-06 ENCOUNTER — Other Ambulatory Visit: Payer: Self-pay | Admitting: Nurse Practitioner

## 2023-08-06 DIAGNOSIS — M810 Age-related osteoporosis without current pathological fracture: Secondary | ICD-10-CM

## 2023-08-06 MED ORDER — DENOSUMAB 60 MG/ML ~~LOC~~ SOSY
60.0000 mg | PREFILLED_SYRINGE | SUBCUTANEOUS | Status: AC
  Administered 2023-08-14: 60 mg via SUBCUTANEOUS

## 2023-08-06 NOTE — Telephone Encounter (Signed)
 Deductible:  none   OOP MAX: $3900 ($185 met)   Annual exam: 02/08/23 TW  Calcium:   9.4    Date: 02-27-23  Upcoming dental procedures: No   Hx of Kidney Disease: No   Last Bone Density Scan: 06/29/20, scheduled 04-14-24   Is Prior Authorization needed: yes, approved through 08/05/24. Auth #: [id number].  Pt estimated Cost: 310.64   Coverage Details: Prolia will be subject to 20% coinsurance up to a $3,900 OOP max ($185 met). Once met, coverage does increase to 100%. Administration is subject to $20 copay, which does contribute to OOP max. No deductible applies.

## 2023-08-06 NOTE — Telephone Encounter (Signed)
 Call to patient. Patient advised of benefits for prolia as seen below and patient verbalized understanding. Patient scheduled for injection on 08/14/23 at 0945. Patient agreeable to date and time of appointment. Order placed. Summary of benefits and PA scanned into Epic.   Encounter closed.

## 2023-08-14 ENCOUNTER — Ambulatory Visit (INDEPENDENT_AMBULATORY_CARE_PROVIDER_SITE_OTHER)

## 2023-08-14 DIAGNOSIS — M810 Age-related osteoporosis without current pathological fracture: Secondary | ICD-10-CM | POA: Diagnosis not present

## 2023-08-14 NOTE — Progress Notes (Signed)
 Prolia  injection given SQ Right Arm.  Patient tolerated injection well.  Last B&P 02/08/23 Antonio Klinefelter NP

## 2023-09-03 ENCOUNTER — Encounter (HOSPITAL_COMMUNITY): Payer: Self-pay

## 2023-09-10 ENCOUNTER — Ambulatory Visit
Admission: RE | Admit: 2023-09-10 | Discharge: 2023-09-10 | Disposition: A | Discharge status: Home / Self Care | Source: Ambulatory Visit | Attending: Nurse Practitioner | Admitting: Nurse Practitioner

## 2023-09-10 DIAGNOSIS — Z1231 Encounter for screening mammogram for malignant neoplasm of breast: Secondary | ICD-10-CM

## 2023-09-21 ENCOUNTER — Other Ambulatory Visit: Payer: Medicare Other

## 2024-01-11 ENCOUNTER — Other Ambulatory Visit: Payer: Self-pay | Admitting: *Deleted

## 2024-01-11 DIAGNOSIS — M810 Age-related osteoporosis without current pathological fracture: Secondary | ICD-10-CM

## 2024-01-11 MED ORDER — DENOSUMAB 60 MG/ML ~~LOC~~ SOSY: 60.0000 mg | PREFILLED_SYRINGE | SUBCUTANEOUS | Status: AC

## 2024-02-07 ENCOUNTER — Ambulatory Visit: Payer: Self-pay | Admitting: Nurse Practitioner

## 2024-02-07 ENCOUNTER — Ambulatory Visit (HOSPITAL_BASED_OUTPATIENT_CLINIC_OR_DEPARTMENT_OTHER)
Admission: RE | Admit: 2024-02-07 | Discharge: 2024-02-07 | Disposition: A | Discharge status: Home / Self Care | Source: Ambulatory Visit | Attending: Nurse Practitioner | Admitting: Nurse Practitioner

## 2024-02-07 DIAGNOSIS — M810 Age-related osteoporosis without current pathological fracture: Secondary | ICD-10-CM | POA: Insufficient documentation

## 2024-02-12 ENCOUNTER — Encounter: Admitting: Nurse Practitioner

## 2024-02-13 ENCOUNTER — Encounter: Payer: Self-pay | Admitting: Nurse Practitioner

## 2024-02-13 ENCOUNTER — Ambulatory Visit: Admitting: Nurse Practitioner

## 2024-02-13 VITALS — BP 110/62 | HR 55 | Ht 61.5 in | Wt 115.0 lb

## 2024-02-13 DIAGNOSIS — R9230 Dense breasts, unspecified: Secondary | ICD-10-CM

## 2024-02-13 DIAGNOSIS — M810 Age-related osteoporosis without current pathological fracture: Secondary | ICD-10-CM

## 2024-02-13 DIAGNOSIS — Z853 Personal history of malignant neoplasm of breast: Secondary | ICD-10-CM

## 2024-02-13 DIAGNOSIS — B009 Herpesviral infection, unspecified: Secondary | ICD-10-CM | POA: Diagnosis not present

## 2024-02-13 DIAGNOSIS — R5382 Chronic fatigue, unspecified: Secondary | ICD-10-CM

## 2024-02-13 DIAGNOSIS — Z780 Asymptomatic menopausal state: Secondary | ICD-10-CM | POA: Diagnosis not present

## 2024-02-13 DIAGNOSIS — Z01419 Encounter for gynecological examination (general) (routine) without abnormal findings: Secondary | ICD-10-CM

## 2024-02-13 DIAGNOSIS — Z9289 Personal history of other medical treatment: Secondary | ICD-10-CM | POA: Diagnosis not present

## 2024-02-13 DIAGNOSIS — Z9189 Other specified personal risk factors, not elsewhere classified: Secondary | ICD-10-CM

## 2024-02-13 NOTE — Progress Notes (Signed)
 Crystal Tate February 06, 1941 [id number]   History:  83 y.o. G2P2002 presents for breast and pelvic exam. Postmenopausal - no HRT, no bleeding. 2018 right breast cancer managed with lumpectomy, radiation, and antiestrogen therapy (completed 5-year course of Anastrozole ). Osteoporosis - has been on Prolia  with much improvement. Insurance no longer covering Prolia . History of squamous cell skin cancer. Reports feeling tired daily. Has energy first couple hours each day only. Has had normal workup with PCP. Sleeps well between 11pm-7a. Did sleep later while visiting daughter in [place]  this summer and also was not working out during that time. She is starting to get back to exercising. Was started on Prozac earlier this year and has noticed she is crying less. 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   Gynecologic History Patient's last menstrual period was 02/28/1999.   Contraception/Family planning: post menopausal status Sexually active: No  Health Maintenance Last Pap: 04/01/2013. Results were: Normal Last mammogram: 09/10/2023. Results were: Normal Last colonoscopy: 2020. Results were: Normal, screenings no longer recommended Last Dexa: 02/07/2024. Results were: T-score -2.6 of spine (-3.1 in 2022)  Past medical history, past surgical history, family history and social history were all reviewed and documented in the EPIC chart. Widowed. Son lives here - married, 2 children. Daughter in [place] , has 2 children. She spends the summer in [place] . Loves to read and garden.   ROS:  A ROS was performed and pertinent positives and negatives are included.  Exam:  Vitals:   02/13/24 1129  BP: 110/62  Pulse: (!) 55  SpO2: 99%  Weight: 115 lb (52.2 kg)  Height: 5' 1.5 (1.562 m)      Body mass index is 21.38 kg/m.  General appearance:  Normal Thyroid:  Symmetrical, normal in size, without palpable masses or nodularity. Respiratory  Auscultation:  Clear without wheezing or  rhonchi Cardiovascular  Auscultation:  Regular rate, without rubs, murmurs or gallops  Edema/varicosities:  Not grossly evident Abdominal  Soft,nontender, without masses, guarding or rebound.  Liver/spleen:  No organomegaly noted  Hernia:  None appreciated  Skin  Inspection:  Grossly normal Breasts: Examined lying and sitting.   Right: Without masses, retractions, nipple discharge or axillary adenopathy.   Left: Without masses, retractions, nipple discharge or axillary adenopathy. Pelvic: External genitalia:  no lesions              Urethra:  normal appearing urethra with no masses, tenderness or lesions              Bartholins and Skenes: normal                 Vagina: normal appearing vagina with normal color and discharge, no lesions. Atrophic changes              Cervix: no lesions Bimanual Exam:  Uterus:  no masses or tenderness              Adnexa: no mass, fullness, tenderness              Rectovaginal: Deferred              Anus:  normal, no lesions  Crystal Tate, CMA present as chaperone.   Assessment/Plan:  83 y.o. [id number] for annual exam.   Encounter for breast and pelvic examination - Education provided on SBEs, importance of preventative screenings, current guidelines, high calcium diet, regular exercise, and multivitamin daily. Labs with PCP.   Age-related osteoporosis without current pathological fracture - Has had much improvement with  Prolia . Will request alternative due to insurance no longer covering. 01/2024 T-score -2.6 of spine (-3.1 in 2022). Plans to get back to walking daily and [facility].   Postmenopausal - no HRT, no bleeding  History of breast cancer - 2018 right breast cancer managed with lumpectomy, radiation, and antiestrogen therapy (completed course of Anastrozole  last year). Followed by oncology. Continue annual screenings.  Normal breast exam today. Recommend annual breast MRI due to increased risk.   Breast  density - Cat C density with history of breast cancer. Recommend annual breast MRI.   Chronic fatigue - likely multifactorial - age, depression, cardiac medications, decrease in exercise. Recommend getting [facility] [employer] and visiting with friends. Continue Prozac since this has helped with crying episodes. Discuss meds with cardiology.   Screening for cervical cancer - Normal Pap history.  No longer screening per guidelines.  Screening for colon cancer - 2020 colonoscopy. No longer screening per GI recommendations.   Return in about 1 year (around 02/12/2025) for B&P (high risk).     Crystal DELENA Shutter DNP, 12:14 PM 02/13/2024

## 2024-02-14 ENCOUNTER — Telehealth: Payer: Self-pay

## 2024-02-14 ENCOUNTER — Other Ambulatory Visit: Payer: Self-pay

## 2024-02-14 ENCOUNTER — Encounter (INDEPENDENT_AMBULATORY_CARE_PROVIDER_SITE_OTHER): Payer: Medicare Other | Admitting: Ophthalmology

## 2024-02-14 DIAGNOSIS — H43813 Vitreous degeneration, bilateral: Secondary | ICD-10-CM

## 2024-02-14 DIAGNOSIS — D3131 Benign neoplasm of right choroid: Secondary | ICD-10-CM | POA: Diagnosis not present

## 2024-02-14 DIAGNOSIS — Z9189 Other specified personal risk factors, not elsewhere classified: Secondary | ICD-10-CM

## 2024-02-14 NOTE — Telephone Encounter (Signed)
 Patient states she would like to go to [facility]. Order was placed. Patient was given their address & phone number to call them to schedule.

## 2024-02-14 NOTE — Telephone Encounter (Signed)
 I called patient to see which location she prefers for the breast MRI with & without contrast. Left a message for her to callback.

## 2024-02-14 NOTE — Telephone Encounter (Signed)
-----   Message from [name] sent at 02/13/2024 12:13 PM EDT ----- Regarding: Breast MRI Please send referral for breast MRI for high risk for breast cancer.

## 2024-02-27 ENCOUNTER — Other Ambulatory Visit: Payer: Self-pay | Admitting: *Deleted

## 2024-02-27 ENCOUNTER — Encounter: Payer: Self-pay | Admitting: *Deleted

## 2024-02-27 DIAGNOSIS — M810 Age-related osteoporosis without current pathological fracture: Secondary | ICD-10-CM

## 2024-02-27 MED ORDER — JUBBONTI 60 MG/ML ~~LOC~~ SOSY: 60.0000 mg | PREFILLED_SYRINGE | SUBCUTANEOUS | 0 refills | Status: AC

## 2024-02-27 MED ORDER — DENOSUMAB-BBDZ 60 MG/ML ~~LOC~~ SOSY
60.0000 mg | PREFILLED_SYRINGE | Freq: Once | SUBCUTANEOUS | Status: AC
  Administered 2024-03-25: 60 mg via SUBCUTANEOUS

## 2024-02-29 ENCOUNTER — Other Ambulatory Visit (HOSPITAL_COMMUNITY): Payer: Self-pay

## 2024-02-29 ENCOUNTER — Telehealth: Payer: Self-pay

## 2024-02-29 NOTE — Telephone Encounter (Signed)
 Crystal Tate

## 2024-02-29 NOTE — Telephone Encounter (Signed)
 Buy/Bill (Office supplied medication)  Out-of-pocket cost due at time of clinic visit: $352  Number of injection/visits approved: 2  Primary: [employer] Co-insurance: 20% Admin fee co-insurance: $20  Secondary: --- Co-insurance:  Admin fee co-insurance:   Medical Benefit Details: Date Benefits were checked: 01/17/24 Deductible: NO/ Coinsurance: 20%/ Admin Fee: $20  Prior Auth: APPROVED PA# [id number] Expiration Date: 02/29/24  # of doses approved: 02/28/25 -----------------------------------------------------------------------  Patient NOT eligible for Copay Card. Copay Card can make patient's cost as little as $25. Link to apply: [email or web address]://[email or web address].[email or web address]  ** This summary of benefits is an estimation of the patient's out-of-pocket cost. Exact cost may very based on individual plan coverage.

## 2024-02-29 NOTE — Telephone Encounter (Signed)
 Medication will be filled under Medical Benefit. MTDM appointment request cancelled.

## 2024-02-29 NOTE — Telephone Encounter (Signed)
 Proceed with MTDM services  Med obtained from pharmacy and shipped to clinic:  Pharmacy benefit: Copay $444.92 (Paid to pharmacy) Admin Fee: $20 (Pay at clinic)  Prior Auth: N/A PA# Expiration Date:   # of doses approved:   Patient NOT eligible for Copay Card. Copay Card can make patient's cost as little as $25. Link to apply: [email or web address]://[email or web address].[email or web address]  ** This summary of benefits is an estimation of the patient's out-of-pocket cost. Exact cost may very based on individual plan coverage.

## 2024-03-04 ENCOUNTER — Ambulatory Visit: Payer: Self-pay | Admitting: Nurse Practitioner

## 2024-03-04 ENCOUNTER — Ambulatory Visit (HOSPITAL_COMMUNITY)
Admission: RE | Admit: 2024-03-04 | Discharge: 2024-03-04 | Disposition: A | Discharge status: Home / Self Care | Source: Ambulatory Visit | Attending: Nurse Practitioner | Admitting: Nurse Practitioner

## 2024-03-04 DIAGNOSIS — Z9189 Other specified personal risk factors, not elsewhere classified: Secondary | ICD-10-CM | POA: Diagnosis present

## 2024-03-04 MED ORDER — GADOBUTROL 1 MMOL/ML IV SOLN
5.0000 mL | Freq: Once | INTRAVENOUS | Status: AC | PRN
  Administered 2024-03-04: 5 mL via INTRAVENOUS

## 2024-03-12 NOTE — Telephone Encounter (Signed)
 See referral

## 2024-03-25 ENCOUNTER — Ambulatory Visit (INDEPENDENT_AMBULATORY_CARE_PROVIDER_SITE_OTHER)

## 2024-03-25 DIAGNOSIS — M810 Age-related osteoporosis without current pathological fracture: Secondary | ICD-10-CM | POA: Diagnosis not present

## 2024-03-25 NOTE — Progress Notes (Signed)
 Pt called later in the afternoon to ask the correct spelling of this medication. Pt also asked about the dosage. Information was given to Pt and Pt was also reminded that she can also find a copy of her AVS on her Mychart, for future reference. Pt was very pleased.

## 2024-04-14 ENCOUNTER — Other Ambulatory Visit

## 2025-02-17 ENCOUNTER — Encounter: Admitting: Nurse Practitioner

## 2025-02-19 ENCOUNTER — Encounter (INDEPENDENT_AMBULATORY_CARE_PROVIDER_SITE_OTHER): Admitting: Ophthalmology
